# Patient Record
Sex: Female | Born: 1985 | Race: White | Hispanic: No | Marital: Single | Smoking: Current every day smoker
Health system: Southern US, Community
[De-identification: ages and names within clinical notes are randomized; demographics above are authoritative.]

## PROBLEM LIST (undated history)

## (undated) DIAGNOSIS — N809 Endometriosis, unspecified: Secondary | ICD-10-CM

---
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
## 2007-04-02 ENCOUNTER — Emergency Department: Payer: Self-pay | Admitting: Internal Medicine

## 2008-04-02 ENCOUNTER — Emergency Department: Payer: Self-pay | Admitting: Emergency Medicine

## 2008-04-09 ENCOUNTER — Inpatient Hospital Stay: Payer: Self-pay | Admitting: Surgery

## 2008-12-27 ENCOUNTER — Observation Stay: Payer: Self-pay

## 2013-10-29 ENCOUNTER — Emergency Department: Payer: Self-pay | Admitting: Emergency Medicine

## 2013-10-29 LAB — URINALYSIS, COMPLETE
BACTERIA: NONE SEEN
BILIRUBIN, UR: NEGATIVE
GLUCOSE, UR: NEGATIVE mg/dL (ref 0–75)
Ketone: NEGATIVE
Leukocyte Esterase: NEGATIVE
NITRITE: NEGATIVE
PH: 5 (ref 4.5–8.0)
Protein: NEGATIVE
Specific Gravity: 1.011 (ref 1.003–1.030)

## 2013-10-29 LAB — WET PREP, GENITAL

## 2013-10-29 LAB — GC/CHLAMYDIA PROBE AMP

## 2015-05-11 ENCOUNTER — Encounter: Payer: Self-pay | Admitting: Emergency Medicine

## 2015-05-11 ENCOUNTER — Emergency Department
Admission: EM | Admit: 2015-05-11 | Discharge: 2015-05-11 | Disposition: A | Discharge status: Home / Self Care | Payer: Self-pay | Attending: Emergency Medicine | Admitting: Emergency Medicine

## 2015-05-11 DIAGNOSIS — Y9289 Other specified places as the place of occurrence of the external cause: Secondary | ICD-10-CM | POA: Insufficient documentation

## 2015-05-11 DIAGNOSIS — W228XXA Striking against or struck by other objects, initial encounter: Secondary | ICD-10-CM | POA: Insufficient documentation

## 2015-05-11 DIAGNOSIS — Z880 Allergy status to penicillin: Secondary | ICD-10-CM | POA: Insufficient documentation

## 2015-05-11 DIAGNOSIS — Y9389 Activity, other specified: Secondary | ICD-10-CM | POA: Insufficient documentation

## 2015-05-11 DIAGNOSIS — Y998 Other external cause status: Secondary | ICD-10-CM | POA: Insufficient documentation

## 2015-05-11 DIAGNOSIS — S0502XA Injury of conjunctiva and corneal abrasion without foreign body, left eye, initial encounter: Secondary | ICD-10-CM | POA: Insufficient documentation

## 2015-05-11 MED ORDER — HYDROXYZINE HCL 50 MG PO TABS: 50.0000 mg | ORAL_TABLET | Freq: Three times a day (TID) | ORAL | Status: DC | PRN

## 2015-05-11 MED ORDER — OXYCODONE-ACETAMINOPHEN 5-325 MG PO TABS
2.0000 | ORAL_TABLET | Freq: Once | ORAL | Status: AC
  Administered 2015-05-11: 2 via ORAL
  Filled 2015-05-11: qty 2

## 2015-05-11 MED ORDER — FLUORESCEIN SODIUM 1 MG OP STRP
ORAL_STRIP | OPHTHALMIC | Status: AC
  Filled 2015-05-11: qty 1

## 2015-05-11 MED ORDER — GENTAMICIN SULFATE 0.3 % OP SOLN: 1.0000 [drp] | Freq: Four times a day (QID) | OPHTHALMIC | Status: DC

## 2015-05-11 MED ORDER — HYDROXYZINE HCL 50 MG PO TABS
50.0000 mg | ORAL_TABLET | Freq: Once | ORAL | Status: AC
  Administered 2015-05-11: 50 mg via ORAL
  Filled 2015-05-11: qty 1

## 2015-05-11 MED ORDER — TETRACAINE HCL 0.5 % OP SOLN
OPHTHALMIC | Status: AC
  Filled 2015-05-11: qty 2

## 2015-05-11 MED ORDER — TRAMADOL HCL 50 MG PO TABS: 50.0000 mg | ORAL_TABLET | Freq: Four times a day (QID) | ORAL | Status: DC | PRN

## 2015-05-11 MED ORDER — EYE WASH OPHTH SOLN
OPHTHALMIC | Status: AC
  Filled 2015-05-11: qty 118

## 2015-05-11 NOTE — ED Notes (Addendum)
Pt reports that she "stabbed" herself in the eye with mascara wand yesterday morning. Pt is having difficulty opening her eye and it is very sensitive to light. Eye is red, swollen, and draining. Pt alert & oriented.

## 2015-05-11 NOTE — ED Notes (Signed)
Pt discharged home after verbalizing understanding of discharge instructions; nad noted. 

## 2015-05-11 NOTE — ED Provider Notes (Signed)
[facility] Emergency Department Provider Note  ____________________________________________  Time seen: Approximately 10:13 AM  I have reviewed the triage vital signs and the nursing notes.   HISTORY  Chief Complaint Eye Drainage    HPI Shari Thornton is a 29 y.o. female left eye painsecondary to stabbing himself with an eye mascara applicator yesterday. Patient had pain and difficulty opening her eye. Patient states she is very sensitive to light. Patient has watery discharge mild edema. No palliative measures taken for this complaint. Patient is rating her pain as a 10 over 10. Patient describes the pain as sharp.  History reviewed. No pertinent past medical history.  There are no active problems to display for this patient.   History reviewed. No pertinent past surgical history.  Current Outpatient Rx  Name  Route  Sig  Dispense  Refill  . gentamicin (GARAMYCIN) 0.3 % ophthalmic solution   Left Eye   Place 1 drop into the left eye 4 (four) times daily.   5 mL   0   . traMADol (ULTRAM) 50 MG tablet   Oral   Take 1 tablet (50 mg total) by mouth every 6 (six) hours as needed for moderate pain.   12 tablet   0     Allergies Amoxil and Penicillins  No family history on file.  Social History Social History  Substance Use Topics  . Smoking status: Never Smoker   . Smokeless tobacco: None  . Alcohol Use: No    Review of Systems Constitutional: No fever/chills Eyes: No visual changes. ENT: No sore throat. Cardiovascular: Denies chest pain. Respiratory: Denies shortness of breath. Gastrointestinal: No abdominal pain.  No nausea, no vomiting.  No diarrhea.  No constipation. Genitourinary: Negative for dysuria. Musculoskeletal: Negative for back pain. Skin: Negative for rash. Neurological: Negative for headaches, focal weakness or numbness. Hematological/Lymphatic: Allergic/Immunilogical: Amoxicillin  10-point ROS otherwise  negative.  ____________________________________________   PHYSICAL EXAM:  VITAL SIGNS: ED Triage Vitals  Enc Vitals Group     BP --      Pulse --      Resp --      Temp --      Temp src --      SpO2 --      Weight --      Height --      Head Cir --      Peak Flow --      Pain Score 05/11/15 0906 7     Pain Loc --      Pain Edu? --      Excl. in GC? --     Constitutional: Alert and oriented. Moderate distress Eyes: Conjunctivae are normal. PERRL. EOMI. unable to give vision acuity affected eye secondary to complain of pain. Fluoresceins stain revealed no 0.5 cm corneal abrasion. Head: Atraumatic. Nose: No congestion/rhinnorhea. Mouth/Throat: Mucous membranes are moist.  Oropharynx non-erythematous. Neck: No stridor. No cervical spine tenderness to palpation. Hematological/Lymphatic/Immunilogical: No cervical lymphadenopathy. Cardiovascular: Normal rate, regular rhythm. Grossly normal heart sounds.  Good peripheral circulation. Respiratory: Normal respiratory effort.  No retractions. Lungs CTAB. Gastrointestinal: Soft and nontender. No distention. No abdominal bruits. No CVA tenderness. Musculoskeletal: No lower extremity tenderness nor edema.  No joint effusions. Neurologic:  Normal speech and language. No gross focal neurologic deficits are appreciated. No gait instability. Skin:  Skin is warm, dry and intact. No rash noted. Psychiatric: Mood and affect are normal. Speech and behavior are normal.  ____________________________________________   LABS (all  labs ordered are listed, but only abnormal results are displayed)  Labs Reviewed - No data to display ____________________________________________  EKG   ____________________________________________  RADIOLOGY   ____________________________________________   PROCEDURES  Procedure(s) performed: None  Critical Care performed: No  ____________________________________________   INITIAL IMPRESSION /  ASSESSMENT AND PLAN / ED COURSE  Pertinent labs & imaging results that were available during my care of the patient were reviewed by me and considered in my medical decision making (see chart for details).  Left corneal abrasion. Patient given discharge home care instructions. Patient given a prescription for Garamycin ophthalmic drops, Atarax and tramadol. Patient advised to follow-up with the [facility] if no improvement or worsening her complaint in 2 days. ____________________________________________   FINAL CLINICAL IMPRESSION(S) / ED DIAGNOSES  Final diagnoses:  Left corneal abrasion, initial encounter      Joni ReiningRonald K Chrstopher Malenfant, PA-C 05/11/15 1037  Joni Reiningonald K Kamrin Spath, PA-C 05/11/15 1039  Emily FilbertJonathan E Williams, MD 05/11/15 1350

## 2015-05-11 NOTE — Discharge Instructions (Signed)
Corneal Abrasion °The cornea is the clear covering at the front and center of the eye. When you look at the colored portion of the eye, you are looking through the cornea. It is a thin tissue made up of layers. The top layer is the most sensitive layer. A corneal abrasion happens if this layer is scratched or an injury causes it to come off.  °HOME CARE °· You may be given drops or a medicated cream. Use the medicine as told by your doctor. °· A pressure patch may be put over the eye. If this is done, follow your doctor's instructions for when to remove the patch. Do not drive or use machines while the eye patch is on. Judging distances is hard to do with a patch on. °· See your doctor for a follow-up exam if you are told to do so. It is very important that you keep this appointment. °GET HELP IF:  °· You have pain, are sensitive to light, and have a scratchy feeling in one eye or both eyes. °· Your pressure patch keeps getting loose. You can blink your eye under the patch. °· You have fluid coming from your eye or the lids stick together in the morning. °· You have the same symptoms in the morning that you did with the first abrasion. This could be days, weeks, or months after the first abrasion healed. °  °This information is not intended to replace advice given to you by your health care provider. Make sure you discuss any questions you have with your health care provider. °  °Document Released: 10/18/2007 Document Revised: 01/20/2015 Document Reviewed: 01/06/2013 °Elsevier Interactive Patient Education ©2016 Elsevier [employer]. ° °

## 2015-05-11 NOTE — ED Notes (Signed)
States she stuck a mascara brush in left eye  Eye red and irritated

## 2015-08-05 ENCOUNTER — Emergency Department
Admission: EM | Admit: 2015-08-05 | Discharge: 2015-08-05 | Disposition: A | Discharge status: Home / Self Care | Payer: Self-pay | Attending: Emergency Medicine | Admitting: Emergency Medicine

## 2015-08-05 ENCOUNTER — Emergency Department: Payer: Self-pay

## 2015-08-05 ENCOUNTER — Encounter: Payer: Self-pay | Admitting: Emergency Medicine

## 2015-08-05 DIAGNOSIS — Z792 Long term (current) use of antibiotics: Secondary | ICD-10-CM | POA: Insufficient documentation

## 2015-08-05 DIAGNOSIS — J069 Acute upper respiratory infection, unspecified: Secondary | ICD-10-CM

## 2015-08-05 DIAGNOSIS — R0789 Other chest pain: Secondary | ICD-10-CM | POA: Insufficient documentation

## 2015-08-05 DIAGNOSIS — Z880 Allergy status to penicillin: Secondary | ICD-10-CM | POA: Insufficient documentation

## 2015-08-05 DIAGNOSIS — J101 Influenza due to other identified influenza virus with other respiratory manifestations: Secondary | ICD-10-CM | POA: Insufficient documentation

## 2015-08-05 LAB — RAPID INFLUENZA A&B ANTIGENS
Influenza A (ARMC): POSITIVE — AB
Influenza B (ARMC): NEGATIVE

## 2015-08-05 MED ORDER — IBUPROFEN 800 MG PO TABS: 800.0000 mg | ORAL_TABLET | Freq: Three times a day (TID) | ORAL | Status: DC | PRN

## 2015-08-05 MED ORDER — GUAIFENESIN-CODEINE 100-10 MG/5ML PO SOLN: 10.0000 mL | ORAL | Status: DC | PRN

## 2015-08-05 MED ORDER — OSELTAMIVIR PHOSPHATE 75 MG PO CAPS: 75.0000 mg | ORAL_CAPSULE | Freq: Two times a day (BID) | ORAL | Status: DC

## 2015-08-05 MED ORDER — AZITHROMYCIN 250 MG PO TABS: ORAL_TABLET | ORAL | Status: DC

## 2015-08-05 MED ORDER — CHLORPHENIRAMINE MALEATE 4 MG PO TABS: 4.0000 mg | ORAL_TABLET | Freq: Two times a day (BID) | ORAL | Status: DC | PRN

## 2015-08-05 MED ORDER — PSEUDOEPHEDRINE HCL 60 MG PO TABS: 60.0000 mg | ORAL_TABLET | ORAL | Status: DC | PRN

## 2015-08-05 NOTE — ED Notes (Signed)
See triage note   States she developed fever,body aches and cough 2 days ago.. Having some discomfort in chest with cough

## 2015-08-05 NOTE — ED Provider Notes (Signed)
[facility] Emergency Department Provider Note  ____________________________________________  Time seen: Approximately 10:02 AM  I have reviewed the triage vital signs and the nursing notes.   HISTORY  Chief Complaint Generalized Body Aches; Fever; and Cough    HPI Shari Thornton is a 30 y.o. female presents for evaluation of fever chills body aches and cough for 2 days. Patient states that she has some discomfort in her chest associated with a cough. Patient reports taking medicines over-the-counter but has not had any relief.   History reviewed. No pertinent past medical history.  There are no active problems to display for this patient.   History reviewed. No pertinent past surgical history.  Current Outpatient Rx  Name  Route  Sig  Dispense  Refill  . azithromycin (ZITHROMAX Z-PAK) 250 MG tablet      Take 2 tablets (500 mg) on  Day 1,  followed by 1 tablet (250 mg) once daily on Days 2 through 5.   6 each   0   . chlorpheniramine (CHLOR-TRIMETON) 4 MG tablet   Oral   Take 1 tablet (4 mg total) by mouth 2 (two) times daily as needed for allergies or rhinitis.   30 tablet   0   . gentamicin (GARAMYCIN) 0.3 % ophthalmic solution   Left Eye   Place 1 drop into the left eye 4 (four) times daily.   5 mL   0   . guaiFENesin-codeine 100-10 MG/5ML syrup   Oral   Take 10 mLs by mouth every 4 (four) hours as needed for cough.   180 mL   0   . hydrOXYzine (ATARAX/VISTARIL) 50 MG tablet   Oral   Take 1 tablet (50 mg total) by mouth 3 (three) times daily as needed.   30 tablet   0   . ibuprofen (ADVIL,MOTRIN) 800 MG tablet   Oral   Take 1 tablet (800 mg total) by mouth every 8 (eight) hours as needed.   30 tablet   0   . oseltamivir (TAMIFLU) 75 MG capsule   Oral   Take 1 capsule (75 mg total) by mouth 2 (two) times daily.   10 capsule   0   . pseudoephedrine (SUDAFED) 60 MG tablet   Oral   Take 1 tablet (60 mg total) by mouth  every 4 (four) hours as needed for congestion.   24 tablet   0   . traMADol (ULTRAM) 50 MG tablet   Oral   Take 1 tablet (50 mg total) by mouth every 6 (six) hours as needed for moderate pain.   12 tablet   0     Allergies Amoxil; Eggs or egg-derived products; and Penicillins  History reviewed. No pertinent family history.  Social History Social History  Substance Use Topics  . Smoking status: Never Smoker   . Smokeless tobacco: None  . Alcohol Use: No    Review of Systems Constitutional: Positive fever/chills Eyes: No visual changes. ENT: Positive sore throat. Cardiovascular: Denies chest pain. Respiratory: Denies shortness of breath. Positive for productive cough Genitourinary: Negative for dysuria. Musculoskeletal: Positive for generalized muscle aches and chest wall pain. Neurological: Negative for headaches, focal weakness or numbness.  10-point ROS otherwise negative.  ____________________________________________   PHYSICAL EXAM:  VITAL SIGNS: ED Triage Vitals  Enc Vitals Group     BP 08/05/15 0858 124/74 mmHg     Pulse Rate 08/05/15 0858 87     Resp 08/05/15 0858 18  Temp 08/05/15 0858 98.3 F (36.8 C)     Temp Source 08/05/15 0858 Oral     SpO2 08/05/15 0858 97 %     Weight 08/05/15 0858 170 lb (77.111 kg)     Height 08/05/15 0858 5' (1.524 m)     Head Cir --      Peak Flow --      Pain Score 08/05/15 0858 9     Pain Loc --      Pain Edu? --      Excl. in GC? --     Constitutional: Alert and oriented. Well appearing and in no acute distress. Nose: Positive congestion/rhinnorhea. Bilateral anterior turbinate edema noted. Mouth/Throat: Mucous membranes are moist.  Oropharynx non-erythematous. Neck: No stridor.  Full range of motion nontender. Cardiovascular: Normal rate, regular rhythm. Grossly normal heart sounds.  Good peripheral circulation. Respiratory: Normal respiratory effort.  No retractions. Lungs CTAB. Musculoskeletal: No lower  extremity tenderness nor edema.  No joint effusions. Neurologic:  Normal speech and language. No gross focal neurologic deficits are appreciated. No gait instability. Skin:  Skin is warm, dry and intact. No rash noted. Psychiatric: Mood and affect are normal. Speech and behavior are normal.  ____________________________________________   LABS (all labs ordered are listed, but only abnormal results are displayed)  Labs Reviewed  RAPID INFLUENZA A&B ANTIGENS ([facility] ONLY) - Abnormal; Notable for the following:    Influenza A ([facility]) POSITIVE (*)    All other components within normal limits   ____________________________________________   RADIOLOGY  No edema or consolidation. No evidence of pneumonia. ____________________________________________   PROCEDURES  Procedure(s) performed: None  Critical Care performed: No  ____________________________________________   INITIAL IMPRESSION / ASSESSMENT AND PLAN / ED COURSE  Pertinent labs & imaging results that were available during my care of the patient were reviewed by me and considered in my medical decision making (see chart for details).  Acute influenza A. Rx given for Tamiflu, Z-Pak, Robitussin-AC, chlorpheniramine. Patient follow-up with her PCP or return to ER with any worsening symptomology. ____________________________________________   FINAL CLINICAL IMPRESSION(S) / ED DIAGNOSES  Final diagnoses:  Influenza A  URI, acute     This chart was dictated using voice recognition software/Dragon. Despite best efforts to proofread, errors can occur which can change the meaning. Any change was purely unintentional.   Evangeline Dakinharles M Alnita Aybar, PA-C 08/05/15 1620  Emily FilbertJonathan E Williams, MD 08/06/15 1101

## 2015-08-05 NOTE — ED Notes (Signed)
Pt to ed with c/o body aches, fever and cough since Monday.  Pt states daughter and husband both diagnosed with the flu recently.

## 2015-08-05 NOTE — Discharge Instructions (Signed)
Influenza, Adult °Influenza ("the flu") is a viral infection of the respiratory tract. It occurs more often in winter months because people spend more time in close contact with one another. Influenza can make you feel very sick. Influenza easily spreads from person to person (contagious). °CAUSES  °Influenza is caused by a virus that infects the respiratory tract. You can catch the virus by breathing in droplets from an infected person's cough or sneeze. You can also catch the virus by touching something that was recently contaminated with the virus and then touching your mouth, nose, or eyes. °RISKS AND COMPLICATIONS °You may be at risk for a more severe case of influenza if you smoke cigarettes, have diabetes, have chronic heart disease (such as heart failure) or lung disease (such as asthma), or if you have a weakened immune system. Elderly people and pregnant women are also at risk for more serious infections. The most common problem of influenza is a lung infection (pneumonia). Sometimes, this problem can require emergency medical care and may be life threatening. °SIGNS AND SYMPTOMS  °Symptoms typically last 4 to 10 days and may include: °· Fever. °· Chills. °· Headache, body aches, and muscle aches. °· Sore throat. °· Chest discomfort and cough. °· Poor appetite. °· Weakness or feeling tired. °· Dizziness. °· Nausea or vomiting. °DIAGNOSIS  °Diagnosis of influenza is often made based on your history and a physical exam. A nose or throat swab test can be done to confirm the diagnosis. °TREATMENT  °In mild cases, influenza goes away on its own. Treatment is directed at relieving symptoms. For more severe cases, your health care provider may prescribe antiviral medicines to shorten the sickness. Antibiotic medicines are not effective because the infection is caused by a virus, not by bacteria. °HOME CARE INSTRUCTIONS °· Take medicines only as directed by your health care provider. °· Use a cool mist humidifier  to make breathing easier. °· Get plenty of rest until your temperature returns to normal. This usually takes 3 to 4 days. °· Drink enough fluid to keep your urine clear or pale yellow. °· Cover your mouth and nose when coughing or sneezing, and wash your hands well to prevent the virus from spreading. °· Stay home from work or school until the fever is gone for at least 1 full day. °PREVENTION  °An annual influenza vaccination (flu shot) is the best way to avoid getting influenza. An annual flu shot is now routinely recommended for all adults in the [place] °SEEK MEDICAL CARE IF: °· You experience chest pain, your cough worsens, or you produce more mucus. °· You have nausea, vomiting, or diarrhea. °· Your fever returns or gets worse. °SEEK IMMEDIATE MEDICAL CARE IF: °· You have trouble breathing, you become short of breath, or your skin or nails become bluish. °· You have severe pain or stiffness in the neck. °· You develop a sudden headache, or pain in the face or ear. °· You have nausea or vomiting that you cannot control. °MAKE SURE YOU:  °· Understand these instructions. °· Will watch your condition. °· Will get help right away if you are not doing well or get worse. °  °This information is not intended to replace advice given to you by your health care provider. Make sure you discuss any questions you have with your health care provider. °  °Document Released: 04/28/2000 Document Revised: 05/22/2014 Document Reviewed: 07/31/2011 °Elsevier Interactive Patient Education ©2016 Elsevier Inc. ° °Upper Respiratory Infection, Adult °Most upper respiratory infections (URIs) are a viral infection of the air passages leading to the lungs. A URI affects the nose, throat, and upper air passages. The most common   type of URI is nasopharyngitis and is typically referred to as "the common cold." °URIs run their course and usually go away on their own. Most of the time, a URI does not require medical attention, but sometimes a  bacterial infection in the upper airways can follow a viral infection. This is called a secondary infection. Sinus and middle ear infections are common types of secondary upper respiratory infections. °Bacterial pneumonia can also complicate a URI. A URI can worsen asthma and chronic obstructive pulmonary disease (COPD). Sometimes, these complications can require emergency medical care and may be life threatening.  °CAUSES °Almost all URIs are caused by viruses. A virus is a type of germ and can spread from one person to another.  °RISKS FACTORS °You may be at risk for a URI if:  °· You smoke.   °· You have chronic heart or lung disease. °· You have a weakened defense (immune) system.   °· You are very young or very old.   °· You have nasal allergies or asthma. °· You work in crowded or poorly ventilated areas. °· You work in health care facilities or schools. °SIGNS AND SYMPTOMS  °Symptoms typically develop 2-3 days after you come in contact with a cold virus. Most viral URIs last 7-10 days. However, viral URIs from the influenza virus (flu virus) can last 14-18 days and are typically more severe. Symptoms may include:  °· Runny or stuffy (congested) nose.   °· Sneezing.   °· Cough.   °· Sore throat.   °· Headache.   °· Fatigue.   °· Fever.   °· Loss of appetite.   °· Pain in your forehead, behind your eyes, and over your cheekbones (sinus pain). °· Muscle aches.   °DIAGNOSIS  °Your health care provider may diagnose a URI by: °· Physical exam. °· Tests to check that your symptoms are not due to another condition such as: °¨ Strep throat. °¨ Sinusitis. °¨ Pneumonia. °¨ Asthma. °TREATMENT  °A URI goes away on its own with time. It cannot be cured with medicines, but medicines may be prescribed or recommended to relieve symptoms. Medicines may help: °· Reduce your fever. °· Reduce your cough. °· Relieve nasal congestion. °HOME CARE INSTRUCTIONS  °· Take medicines only as directed by your health care provider.    °· Gargle warm saltwater or take cough drops to comfort your throat as directed by your health care provider. °· Use a warm mist humidifier or inhale steam from a shower to increase air moisture. This may make it easier to breathe. °· Drink enough fluid to keep your urine clear or pale yellow.   °· Eat soups and other clear broths and maintain good nutrition.   °· Rest as needed.   °· Return to work when your temperature has returned to normal or as your health care provider advises. You may need to stay home longer to avoid infecting others. You can also use a face mask and careful hand washing to prevent spread of the virus. °· Increase the usage of your inhaler if you have asthma.   °· Do not use any tobacco products, including cigarettes, chewing tobacco, or electronic cigarettes. If you need help quitting, ask your health care provider. °PREVENTION  °The best way to protect yourself from getting a cold is to practice good hygiene.  °· Avoid oral or hand contact with people with cold symptoms.   °· Wash your hands often if contact occurs.   °There is no clear evidence that vitamin C, vitamin E, echinacea, or exercise reduces the chance of developing a cold. However, it   is always recommended to get plenty of rest, exercise, and practice good nutrition.  °SEEK MEDICAL CARE IF:  °· You are getting worse rather than better.   °· Your symptoms are not controlled by medicine.   °· You have chills. °· You have worsening shortness of breath. °· You have brown or red mucus. °· You have yellow or brown nasal discharge. °· You have pain in your face, especially when you bend forward. °· You have a fever. °· You have swollen neck glands. °· You have pain while swallowing. °· You have white areas in the back of your throat. °SEEK IMMEDIATE MEDICAL CARE IF:  °· You have severe or persistent: °¨ Headache. °¨ Ear pain. °¨ Sinus pain. °¨ Chest pain. °· You have chronic lung disease and any of the  following: °¨ Wheezing. °¨ Prolonged cough. °¨ Coughing up blood. °¨ A change in your usual mucus. °· You have a stiff neck. °· You have changes in your: °¨ Vision. °¨ Hearing. °¨ Thinking. °¨ Mood. °MAKE SURE YOU:  °· Understand these instructions. °· Will watch your condition. °· Will get help right away if you are not doing well or get worse. °  °This information is not intended to replace advice given to you by your health care provider. Make sure you discuss any questions you have with your health care provider. °  °Document Released: 10/25/2000 Document Revised: 09/15/2014 Document Reviewed: 08/06/2013 °Elsevier Interactive Patient Education ©2016 [employer]. ° °

## 2016-05-01 ENCOUNTER — Emergency Department: Payer: Self-pay

## 2016-05-01 ENCOUNTER — Encounter: Payer: Self-pay | Admitting: Emergency Medicine

## 2016-05-01 ENCOUNTER — Emergency Department
Admission: EM | Admit: 2016-05-01 | Discharge: 2016-05-01 | Disposition: A | Discharge status: Home / Self Care | Payer: Self-pay | Attending: Emergency Medicine | Admitting: Emergency Medicine

## 2016-05-01 DIAGNOSIS — Y9389 Activity, other specified: Secondary | ICD-10-CM | POA: Insufficient documentation

## 2016-05-01 DIAGNOSIS — Y999 Unspecified external cause status: Secondary | ICD-10-CM | POA: Insufficient documentation

## 2016-05-01 DIAGNOSIS — Y92009 Unspecified place in unspecified non-institutional (private) residence as the place of occurrence of the external cause: Secondary | ICD-10-CM | POA: Insufficient documentation

## 2016-05-01 DIAGNOSIS — W208XXA Other cause of strike by thrown, projected or falling object, initial encounter: Secondary | ICD-10-CM | POA: Insufficient documentation

## 2016-05-01 DIAGNOSIS — S9031XA Contusion of right foot, initial encounter: Secondary | ICD-10-CM | POA: Insufficient documentation

## 2016-05-01 MED ORDER — HYDROCODONE-ACETAMINOPHEN 5-325 MG PO TABS: 1.0000 | ORAL_TABLET | Freq: Four times a day (QID) | ORAL | 0 refills | Status: DC | PRN

## 2016-05-01 MED ORDER — OXYCODONE-ACETAMINOPHEN 5-325 MG PO TABS
1.0000 | ORAL_TABLET | Freq: Once | ORAL | Status: AC
  Administered 2016-05-01: 1 via ORAL
  Filled 2016-05-01: qty 1

## 2016-05-01 NOTE — ED Provider Notes (Signed)
[facility] Emergency Department Provider Note  ____________________________________________  Time seen: Approximately 1:23 PM  I have reviewed the triage vital signs and the nursing notes.   HISTORY  Chief Complaint Foot Pain    HPI Shari Thornton is a 30 y.o. female that presents to the emergency department with right foot pain for 1 day. Patient was cleaning house yesterday and a very heavy wooden picture frame fell on right foot. Patient states that the swelling has improved since yesterday. Patient has continued to walk on foot. Patient has crutches at home that she has been trying to use. Patient has not taken anything today for pain.   History reviewed. No pertinent past medical history.  There are no active problems to display for this patient.   History reviewed. No pertinent surgical history.  Prior to Admission medications   Medication Sig Start Date End Date Taking? Authorizing Provider  HYDROcodone-acetaminophen (NORCO/VICODIN) 5-325 MG tablet Take 1 tablet by mouth every 6 (six) hours as needed for moderate pain. 05/01/16   Enid DerryAshley Aaisha Sliter, PA-C    Allergies Amoxil [amoxicillin]; Eggs or egg-derived products; and Penicillins  No family history on file.  Social History Social History  Substance Use Topics  . Smoking status: Never Smoker  . Smokeless tobacco: Never Used  . Alcohol use No     Review of Systems  Constitutional: No fever/chills ENT: No upper respiratory complaints. Cardiovascular: No chest pain. Respiratory: No cough. No SOB. Gastrointestinal: No abdominal pain.  No nausea, no vomiting.  Skin: Negative for lacerations   ____________________________________________   PHYSICAL EXAM:  VITAL SIGNS: ED Triage Vitals  Enc Vitals Group     BP 05/01/16 1206 (!) 145/87     Pulse Rate 05/01/16 1206 88     Resp 05/01/16 1206 18     Temp 05/01/16 1206 97.9 F (36.6 C)     Temp src --      SpO2 05/01/16 1206 100 %      Weight 05/01/16 1207 180 lb (81.6 kg)     Height 05/01/16 1207 5' (1.524 m)     Head Circumference --      Peak Flow --      Pain Score 05/01/16 1207 8     Pain Loc --      Pain Edu? --      Excl. in GC? --      Constitutional: Alert and oriented. Well appearing and in no acute distress. Eyes: Conjunctivae are normal. PERRL. EOMI. Head: Atraumatic. ENT:      Ears:      Nose: No congestion/rhinnorhea.      Mouth/Throat: Mucous membranes are moist.  Neck: No stridor.  Cardiovascular: Normal rate, regular rhythm. Normal S1 and S2.  Good peripheral circulation. 2+ dorsalis pedis and posterior tibialis pulses. Respiratory: Normal respiratory effort without tachypnea or retractions. Lungs CTAB. Good air entry to the bases with no decreased or absent breath sounds. Musculoskeletal: Full range of motion to all extremities. Swelling and bruising over the dorsal aspect of right foot. Patient is able to move toes. Neurologic:  Normal speech and language. No gross focal neurologic deficits are appreciated. Sensation of toes intact. Skin:  Skin is warm, dry and intact.  Psychiatric: Mood and affect are normal. Speech and behavior are normal. Patient exhibits appropriate insight and judgement.   ____________________________________________   LABS (all labs ordered are listed, but only abnormal results are displayed)  Labs Reviewed - No data to display ____________________________________________  EKG  ____________________________________________  RADIOLOGY Lexine BatonI, Jakiah Goree, personally viewed and evaluated these images (plain radiographs) as part of my medical decision making, as well as reviewing the written report by the radiologist.  Dg Foot Complete Right  Result Date: 05/01/2016 CLINICAL DATA:  Picture frame fell on foot yesterday with pain, initial encounter EXAM: RIGHT FOOT COMPLETE - 3+ VIEW COMPARISON:  None. FINDINGS: Considerable soft tissue swelling is noted of the  dorsum of the foot. No acute fracture or dislocation is noted. IMPRESSION: Soft tissue swelling without acute bony abnormality. Electronically Signed   By: Alcide CleverMark  Lukens M.D.   On: 05/01/2016 12:24    ____________________________________________    PROCEDURES  Procedure(s) performed:    Procedures    Medications  oxyCODONE-acetaminophen (PERCOCET/ROXICET) 5-325 MG per tablet 1 tablet (1 tablet Oral Given 05/01/16 1247)     ____________________________________________   INITIAL IMPRESSION / ASSESSMENT AND PLAN / ED COURSE  Pertinent labs & imaging results that were available during my care of the patient were reviewed by me and considered in my medical decision making (see chart for details).  Review of the [place] CSRS was performed in accordance of the [facility] prior to dispensing any controlled drugs.  Clinical Course     Patient's diagnosis is consistent with foot contusion. Patient states that swelling has gone down since yesterday. Patient has sensation in toes and good blood flow to toes. Patient states foot was wrapped and bruising was applied. Patient has crutches at home that she will use. Patient will be discharged home with prescriptions for Norco. Patient is to follow up with PCP as directed. Patient is given ED precautions to return to the ED for any worsening or new symptoms. All questions were answered.     ____________________________________________  FINAL CLINICAL IMPRESSION(S) / ED DIAGNOSES  Final diagnoses:  Contusion of right foot, initial encounter      NEW MEDICATIONS STARTED DURING THIS VISIT:  Discharge Medication List as of 05/01/2016 12:56 PM          This chart was dictated using voice recognition software/Dragon. Despite best efforts to proofread, errors can occur which can change the meaning. Any change was purely unintentional.    Enid DerryAshley Nahiem Dredge, PA-C 05/01/16 1327    Minna AntisKevin Paduchowski, MD 05/01/16 [id number]

## 2016-05-01 NOTE — ED Notes (Signed)
Ace wrap and post op boot done

## 2016-05-01 NOTE — ED Triage Notes (Signed)
Reports a picture frame falling off a wall onto foot last pm.  Swelling noted.  +PMS

## 2019-04-07 ENCOUNTER — Other Ambulatory Visit: Payer: Self-pay | Admitting: *Deleted

## 2019-04-07 DIAGNOSIS — Z20822 Contact with and (suspected) exposure to covid-19: Secondary | ICD-10-CM

## 2019-04-08 LAB — NOVEL CORONAVIRUS, NAA: SARS-CoV-2, NAA: NOT DETECTED

## 2019-04-24 ENCOUNTER — Emergency Department: Payer: Self-pay

## 2019-04-24 ENCOUNTER — Emergency Department
Admission: EM | Admit: 2019-04-24 | Discharge: 2019-04-24 | Disposition: A | Discharge status: Home / Self Care | Payer: Self-pay | Attending: Emergency Medicine | Admitting: Emergency Medicine

## 2019-04-24 ENCOUNTER — Encounter: Payer: Self-pay | Admitting: Emergency Medicine

## 2019-04-24 ENCOUNTER — Other Ambulatory Visit: Payer: Self-pay

## 2019-04-24 DIAGNOSIS — R1012 Left upper quadrant pain: Secondary | ICD-10-CM | POA: Insufficient documentation

## 2019-04-24 DIAGNOSIS — R9389 Abnormal findings on diagnostic imaging of other specified body structures: Secondary | ICD-10-CM | POA: Insufficient documentation

## 2019-04-24 DIAGNOSIS — K529 Noninfective gastroenteritis and colitis, unspecified: Secondary | ICD-10-CM | POA: Insufficient documentation

## 2019-04-24 DIAGNOSIS — R110 Nausea: Secondary | ICD-10-CM | POA: Insufficient documentation

## 2019-04-24 DIAGNOSIS — R109 Unspecified abdominal pain: Secondary | ICD-10-CM

## 2019-04-24 LAB — URINALYSIS, COMPLETE (UACMP) WITH MICROSCOPIC
Bacteria, UA: NONE SEEN
Bilirubin Urine: NEGATIVE
Glucose, UA: NEGATIVE mg/dL
Ketones, ur: NEGATIVE mg/dL
Leukocytes,Ua: NEGATIVE
Nitrite: NEGATIVE
Protein, ur: NEGATIVE mg/dL
Specific Gravity, Urine: 1.021 (ref 1.005–1.030)
pH: 5 (ref 5.0–8.0)

## 2019-04-24 LAB — CBC WITH DIFFERENTIAL/PLATELET
Abs Immature Granulocytes: 0.06 10*3/uL (ref 0.00–0.07)
Basophils Absolute: 0.1 10*3/uL (ref 0.0–0.1)
Basophils Relative: 0 %
Eosinophils Absolute: 0.5 10*3/uL (ref 0.0–0.5)
Eosinophils Relative: 4 %
HCT: 45.6 % (ref 36.0–46.0)
Hemoglobin: 15 g/dL (ref 12.0–15.0)
Immature Granulocytes: 0 %
Lymphocytes Relative: 25 %
Lymphs Abs: 3.5 10*3/uL (ref 0.7–4.0)
MCH: 28.7 pg (ref 26.0–34.0)
MCHC: 32.9 g/dL (ref 30.0–36.0)
MCV: 87.2 fL (ref 80.0–100.0)
Monocytes Absolute: 0.9 10*3/uL (ref 0.1–1.0)
Monocytes Relative: 7 %
Neutro Abs: 8.9 10*3/uL — ABNORMAL HIGH (ref 1.7–7.7)
Neutrophils Relative %: 64 %
Platelets: 301 10*3/uL (ref 150–400)
RBC: 5.23 MIL/uL — ABNORMAL HIGH (ref 3.87–5.11)
RDW: 12.2 % (ref 11.5–15.5)
WBC: 13.9 10*3/uL — ABNORMAL HIGH (ref 4.0–10.5)
nRBC: 0 % (ref 0.0–0.2)

## 2019-04-24 LAB — COMPREHENSIVE METABOLIC PANEL
ALT: 17 U/L (ref 0–44)
AST: 14 U/L — ABNORMAL LOW (ref 15–41)
Albumin: 4 g/dL (ref 3.5–5.0)
Alkaline Phosphatase: 83 U/L (ref 38–126)
Anion gap: 8 (ref 5–15)
BUN: 14 mg/dL (ref 6–20)
CO2: 24 mmol/L (ref 22–32)
Calcium: 8.8 mg/dL — ABNORMAL LOW (ref 8.9–10.3)
Chloride: 105 mmol/L (ref 98–111)
Creatinine, Ser: 0.69 mg/dL (ref 0.44–1.00)
GFR calc Af Amer: >60 mL/min (ref >60)
GFR calc non Af Amer: >60 mL/min (ref >60)
Glucose, Bld: 108 mg/dL — ABNORMAL HIGH (ref 70–99)
Potassium: 4.1 mmol/L (ref 3.5–5.1)
Sodium: 137 mmol/L (ref 135–145)
Total Bilirubin: 0.6 mg/dL (ref 0.3–1.2)
Total Protein: 7.5 g/dL (ref 6.5–8.1)

## 2019-04-24 LAB — POCT PREGNANCY, URINE: Preg Test, Ur: NEGATIVE

## 2019-04-24 MED ORDER — HYDROMORPHONE HCL 1 MG/ML IJ SOLN
1.0000 mg | Freq: Once | INTRAMUSCULAR | Status: AC
  Administered 2019-04-24: 1 mg via INTRAVENOUS
  Filled 2019-04-24: qty 1

## 2019-04-24 MED ORDER — ONDANSETRON 4 MG PO TBDP: 4.0000 mg | ORAL_TABLET | Freq: Three times a day (TID) | ORAL | 0 refills | Status: DC | PRN

## 2019-04-24 MED ORDER — HYDROCODONE-ACETAMINOPHEN 5-325 MG PO TABS: 1.0000 | ORAL_TABLET | Freq: Four times a day (QID) | ORAL | 0 refills | Status: AC | PRN

## 2019-04-24 MED ORDER — KETOROLAC TROMETHAMINE 30 MG/ML IJ SOLN
30.0000 mg | Freq: Once | INTRAMUSCULAR | Status: AC
  Administered 2019-04-24: 30 mg via INTRAVENOUS
  Filled 2019-04-24: qty 1

## 2019-04-24 MED ORDER — ONDANSETRON HCL 4 MG/2ML IJ SOLN
4.0000 mg | Freq: Once | INTRAMUSCULAR | Status: AC
  Administered 2019-04-24: 4 mg via INTRAVENOUS
  Filled 2019-04-24: qty 2

## 2019-04-24 MED ORDER — METRONIDAZOLE 500 MG PO TABS: 500.0000 mg | ORAL_TABLET | Freq: Three times a day (TID) | ORAL | 0 refills | Status: AC

## 2019-04-24 MED ORDER — CIPROFLOXACIN HCL 500 MG PO TABS: 500.0000 mg | ORAL_TABLET | Freq: Two times a day (BID) | ORAL | 0 refills | Status: AC

## 2019-04-24 NOTE — ED Notes (Signed)
Pt verbalized understanding of discharge instructions. NAD at this time. 

## 2019-04-24 NOTE — ED Triage Notes (Signed)
Patient ambulatory to triage with steady gait, without difficulty or distress noted, mask in place; pt reports since yesterday having left flank pain, nonradiating with no accomp symptoms

## 2019-04-24 NOTE — Discharge Instructions (Addendum)
Call Dr. Glenford Peers office to set up an appointment  I've called in pain medications and antibiotics  Stick to bland, non spicy foods for the next several days

## 2019-04-24 NOTE — ED Notes (Signed)
Patient transported to X-ray 

## 2019-04-24 NOTE — ED Provider Notes (Signed)
[facility] Emergency Department Provider Note  ____________________________________________   First MD Initiated Contact with Patient 04/24/19 [phone]     (approximate)  I have reviewed the triage vital signs and the nursing notes.   HISTORY  Chief Complaint Flank Pain    HPI Shari Thornton is a 33 y.o. female  Here with L flank pain. Pt reports that her sx started fairly acutely yesterday. She states she was just resting when she began to notice a sharp, aching, gnawing LUQ and L flank pain with some mild nausea. Pain has since been constant and has remained severe. It seems worse with movement, palpation. No alleviating factors. No cough or SOB, no hemoptysis. No urinary sx. No radiation of pain. She denies fever, chills. Does not recall any recent trauma. No other complaints. NO h/o same.        History reviewed. No pertinent past medical history.  There are no problems to display for this patient.   History reviewed. No pertinent surgical history.  Prior to Admission medications   Medication Sig Start Date End Date Taking? Authorizing Provider  ciprofloxacin (CIPRO) 500 MG tablet Take 1 tablet (500 mg total) by mouth 2 (two) times daily for 7 days. 04/24/19 05/01/19  Shaune Pollack, MD  HYDROcodone-acetaminophen (NORCO/VICODIN) 5-325 MG tablet Take 1 tablet by mouth every 6 (six) hours as needed for moderate pain. 05/01/16   Enid Derry, PA-C  HYDROcodone-acetaminophen (NORCO/VICODIN) 5-325 MG tablet Take 1-2 tablets by mouth every 6 (six) hours as needed for moderate pain or severe pain. 04/24/19 04/23/20  Shaune Pollack, MD  metroNIDAZOLE (FLAGYL) 500 MG tablet Take 1 tablet (500 mg total) by mouth 3 (three) times daily for 7 days. 04/24/19 05/01/19  Shaune Pollack, MD  ondansetron (ZOFRAN ODT) 4 MG disintegrating tablet Take 1 tablet (4 mg total) by mouth every 8 (eight) hours as needed for nausea or vomiting. 04/24/19   Shaune Pollack, MD     Allergies Amoxil [amoxicillin], Eggs or egg-derived products, and Penicillins  No family history on file.  Social History Social History   Tobacco Use   Smoking status: Never Smoker   Smokeless tobacco: Never Used  Substance Use Topics   Alcohol use: No   Drug use: Not on file    Review of Systems  Review of Systems  Constitutional: Negative for fatigue and fever.  HENT: Negative for congestion and sore throat.   Eyes: Negative for visual disturbance.  Respiratory: Negative for cough and shortness of breath.   Cardiovascular: Negative for chest pain.  Gastrointestinal: Positive for abdominal pain. Negative for diarrhea, nausea and vomiting.  Genitourinary: Positive for flank pain.  Musculoskeletal: Negative for back pain and neck pain.  Skin: Negative for rash and wound.  Neurological: Negative for weakness.     ____________________________________________  PHYSICAL EXAM:      VITAL SIGNS: ED Triage Vitals  Enc Vitals Group     BP 04/24/19 0427 127/90     Pulse Rate 04/24/19 0427 100     Resp 04/24/19 0427 16     Temp 04/24/19 0427 99 F (37.2 C)     Temp Source 04/24/19 0427 Oral     SpO2 04/24/19 0427 98 %     Weight 04/24/19 0425 200 lb (90.7 kg)     Height 04/24/19 0425 5' (1.524 m)     Head Circumference --      Peak Flow --      Pain Score 04/24/19 0424 10  Pain Loc --      Pain Edu? --      Excl. in GC? --      Physical Exam Vitals and nursing note reviewed.  Constitutional:      General: She is not in acute distress.    Appearance: She is well-developed.  HENT:     Head: Normocephalic and atraumatic.  Eyes:     Conjunctiva/sclera: Conjunctivae normal.  Cardiovascular:     Rate and Rhythm: Normal rate and regular rhythm.     Heart sounds: Normal heart sounds. No murmur. No friction rub.  Pulmonary:     Effort: Pulmonary effort is normal. No respiratory distress.     Breath sounds: Normal breath sounds. No wheezing or rales.   Abdominal:     General: There is no distension.     Palpations: Abdomen is soft.     Tenderness: There is abdominal tenderness (ttp in LUQ with increased BS; no RLQ TTP, no TTP at [facility]).  Musculoskeletal:     Cervical back: Neck supple.     Comments: Mild TTP over left upper flank, no rash or lesions, no bruising or deformity  Skin:    General: Skin is warm.     Capillary Refill: Capillary refill takes less than 2 seconds.  Neurological:     Mental Status: She is alert and oriented to person, place, and time.     Motor: No abnormal muscle tone.       ____________________________________________   LABS (all labs ordered are listed, but only abnormal results are displayed)  Labs Reviewed  CBC WITH DIFFERENTIAL/PLATELET - Abnormal; Notable for the following components:      Result Value   WBC 13.9 (*)    RBC 5.23 (*)    Neutro Abs 8.9 (*)    All other components within normal limits  COMPREHENSIVE METABOLIC PANEL - Abnormal; Notable for the following components:   Glucose, Bld 108 (*)    Calcium 8.8 (*)    AST 14 (*)    All other components within normal limits  URINALYSIS, COMPLETE (UACMP) WITH MICROSCOPIC - Abnormal; Notable for the following components:   Color, Urine YELLOW (*)    APPearance HAZY (*)    Hgb urine dipstick MODERATE (*)    All other components within normal limits  POCT PREGNANCY, URINE    ____________________________________________  EKG: None ________________________________________  RADIOLOGY All imaging, including plain films, CT scans, and ultrasounds, independently reviewed by me, and interpretations confirmed via formal radiology reads.  ED MD interpretation:   CT Stone: Focal enteritis LUQ, dilated appendix w/o inflammatory changes, ovarian cysts  Official radiology report(s): DG Chest 2 View  Result Date: 04/24/2019 CLINICAL DATA:  Left chest pain. EXAM: CHEST - 2 VIEW COMPARISON:  08/05/2015. FINDINGS: Mediastinum and hilar  structures normal. Low lung volumes mild bibasilar subsegmental atelectasis. No pleural effusion or pneumothorax. No acute bony abnormality identified. IMPRESSION: Low lung volumes mild bibasilar subsegmental atelectasis. Exam otherwise unremarkable. Electronically Signed   By: Maisie Fus  Register   On: 04/24/2019 08:44   CT Renal Stone Study  Result Date: 04/24/2019 CLINICAL DATA:  Left flank pain. EXAM: CT ABDOMEN AND PELVIS WITHOUT CONTRAST TECHNIQUE: Multidetector CT imaging of the abdomen and pelvis was performed following the standard protocol without IV contrast. COMPARISON:  None. FINDINGS: Lower chest: Lung bases are clear. Hepatobiliary: No focal liver abnormality is seen. Elongated left lobe of the liver wrapping into the left upper quadrant. No gallstones, gallbladder wall thickening, or biliary dilatation.  Pancreas: No ductal dilatation or inflammation. Spleen: Normal in size without focal abnormality. Adrenals/Urinary Tract: Normal adrenal glands. No hydronephrosis. No perinephric edema. No stones in the kidneys, course of the ureters or urinary bladder. Urinary bladder is minimally distended. No bladder wall thickening. Stomach/Bowel: Stomach is nondistended and not well assessed. Suspect straight segment of small bowel wall thickening in the left upper quadrant, series 2, image 36 mild adjacent mesenteric edema. No obstruction. There is fecalization of small bowel contents. The appendix is dilated measuring up to 11 mm. Appendiceal low-density may be submucosal fatty infiltration or intraluminal contents. No periappendiceal fat stranding. No periappendiceal collection. Moderate volume of stool throughout the entire colon. No colonic wall thickening or inflammation. Vascular/Lymphatic: Abdominal aorta is normal in caliber. Increased number of small retroperitoneal nodes which are not enlarged by size criteria. Reproductive: Intrauterine device in the uterus. Both ovaries appear prominent size, left  greater than right. Left ovary measures 5.5 x 3.4 cm. Other: Small fat containing umbilical hernia. No ascites. No free air. Musculoskeletal: There are no acute or suspicious osseous abnormalities. IMPRESSION: 1. No renal stones or obstructive uropathy. 2. Short segment small bowel wall thickening in the left upper quadrant with mild adjacent mesenteric edema, suggesting enteritis. 3. Dilated appendix measuring up to 11 mm with low-density, submucosal fatty infiltration versus low-density intraluminal contents. There is no evidence of acute appendicitis or appendiceal inflammation. Differential considerations include chronic appendicitis versus appendix mucocele. Recommend surgical consultation, which could be evaluated on an elective basis given pain is on the left rather than right side. 4. Both ovaries appear prominent size, left greater than right. Query polycystic ovarian syndrome, however ovaries and adnexal structures would be better characterized with ultrasound. 5. Moderate colonic stool burden with fecalization of small bowel contents, can be seen with constipation. Electronically Signed   By: Narda RutherfordMelanie  Sanford M.D.   On: 04/24/2019 05:29    ____________________________________________  PROCEDURES   Procedure(s) performed (including Critical Care):  Procedures  ____________________________________________  INITIAL IMPRESSION / MDM / ASSESSMENT AND PLAN / ED COURSE  As part of my medical decision making, I reviewed the following data within the electronic MEDICAL RECORD NUMBER Nursing notes reviewed and incorporated, Old chart reviewed, Notes from prior ED visits, and [place] Controlled Substance Database       *Shari Thornton was evaluated in Emergency Department on 04/24/2019 for the symptoms described in the history of present illness. She was evaluated in the context of the global COVID-19 pandemic, which necessitated consideration that the patient might be at risk for infection with the  SARS-CoV-2 virus that causes COVID-19. Institutional protocols and algorithms that pertain to the evaluation of patients at risk for COVID-19 are in a state of rapid change based on information released by regulatory bodies including the CDC and federal and state organizations. These policies and algorithms were followed during the patient's care in the ED.  Some ED evaluations and interventions may be delayed as a result of limited staffing during the pandemic.*     Medical Decision Making:  33 yo F here with L flank and LUQ pain. On arrival, VSS and WNL. Labs reviewed from triage - mild leukocytosis noted, o/w reassuring. UA unremarkable. CT scan as above. Interestingly, pt has focal enteritis in LUQ which would explain her LUQ pain/L flank pain, mild leukocytosis, and nausea. No fevers. No diarrhea. Will tx conservatively given her degree of pain w/ ABX an d analgesics. No focal PNA on CT and no SOB, hypoxia. Do not  suspect PE as pain is reproducible on exam, no hypoxia, no risk factors, no leg swelling. Otherwise, she has known ovarian cysts but no adnexal pain, no ovarian enlargement on CT and no signs of torsion clinically. No sx of PID.  Otherwise, incidental note made of abnormal appendix on CT. She has absolutely no RLQ TTP on exam. Suspect this is incidental but discussed with Dr. Celine Ahr. Will refer her for outpt CT W/contrast and further work-up, as ddx could includes appendiceal CA. Could also be related to her PCOS. This was discussed with pt and referral made.  ____________________________________________  FINAL CLINICAL IMPRESSION(S) / ED DIAGNOSES  Final diagnoses:  Left flank pain  Enteritis  Abnormal CT scan     MEDICATIONS GIVEN DURING THIS VISIT:  Medications  HYDROmorphone (DILAUDID) injection 1 mg (1 mg Intravenous Given 04/24/19 0837)  ketorolac (TORADOL) 30 MG/ML injection 30 mg (30 mg Intravenous Given 04/24/19 0837)  ondansetron (ZOFRAN) injection 4 mg (4 mg  Intravenous Given 04/24/19 0836)     ED Discharge Orders         Ordered    ondansetron (ZOFRAN ODT) 4 MG disintegrating tablet  Every 8 hours PRN     04/24/19 0927    HYDROcodone-acetaminophen (NORCO/VICODIN) 5-325 MG tablet  Every 6 hours PRN     04/24/19 0927    ciprofloxacin (CIPRO) 500 MG tablet  2 times daily     04/24/19 0927    metroNIDAZOLE (FLAGYL) 500 MG tablet  3 times daily     04/24/19 7253           Note:  This document was prepared using Dragon voice recognition software and may include unintentional dictation errors.   Duffy Bruce, MD 04/24/19 1037

## 2019-04-24 NOTE — ED Notes (Signed)
Pt to CT via w/c accomp by CT tech 

## 2020-11-24 ENCOUNTER — Ambulatory Visit (LOCAL_COMMUNITY_HEALTH_CENTER): Payer: Self-pay | Admitting: Advanced Practice Midwife

## 2020-11-24 ENCOUNTER — Ambulatory Visit: Payer: Self-pay | Admitting: Advanced Practice Midwife

## 2020-11-24 ENCOUNTER — Other Ambulatory Visit: Payer: Self-pay

## 2020-11-24 VITALS — BP 130/85 | Ht 60.6 in | Wt 235.0 lb

## 2020-11-24 DIAGNOSIS — Z3009 Encounter for other general counseling and advice on contraception: Secondary | ICD-10-CM

## 2020-11-24 DIAGNOSIS — E669 Obesity, unspecified: Secondary | ICD-10-CM | POA: Insufficient documentation

## 2020-11-24 DIAGNOSIS — F17200 Nicotine dependence, unspecified, uncomplicated: Secondary | ICD-10-CM | POA: Insufficient documentation

## 2020-11-24 DIAGNOSIS — Z3049 Encounter for surveillance of other contraceptives: Secondary | ICD-10-CM

## 2020-11-24 LAB — WET PREP FOR TRICH, YEAST, CLUE
Trichomonas Exam: NEGATIVE
Yeast Exam: NEGATIVE

## 2020-11-24 NOTE — Progress Notes (Signed)
[facility] Number: [phone]    Family Planning Visit- Initial Visit  Subjective:  Shari Thornton is a 35 y.o. SWF G2P2 (10, 75) smoker  being seen today for an initial annual visit and to discuss contraceptive options.  The patient is currently using IUD Mirena for pregnancy prevention. Patient reports she does not want a pregnancy in the next year.  Patient has the following medical conditions has Obesity BMI=44.9 on their problem list.  Chief Complaint  Patient presents with   Annual Exam    PE.  Possible IUD removal and reinsertion    Patient reports Mirena inserted 12/21/15 and last menses 10 years ago. Pt states she read on Mirena website that after 5 years she will have a return of menses and wants removal/reinsertion today. Counseled Mirena good for 7 yrs for birth control and she can wait until menses resumes or have removal if desires. Last pap 12/02/15 neg. Last sex yesterday without condom; with current partner x 15 years; 1 sex partner in last 3 mo. Smoking 1 ppd. Last MJ 12 years ago. Lst ETOH 7 years ago (1 mixed drink) "rarely". Employed 43 hours/wk and living with her 2 kids.   Patient denies vaping, cigars  Body mass index is 44.99 kg/m. - Patient is eligible for diabetes screening based on BMI and age >88?  not applicable CZ6S ordered? not applicable  Patient reports 1  partner/s in last year. Desires STI screening?  Yes  Has patient been screened once for HCV in the past?  No  No results found for: HCVAB  Does the patient have current drug use (including MJ), have a partner with drug use, and/or has been incarcerated since last result? No  If yes-- Screen for HCV through [facility]   Does the patient meet criteria for HBV testing? No  Criteria:  -Household, sexual or needle sharing contact with HBV -History of drug use -HIV positive -Those with known Hep C   Health  Maintenance Due  Topic Date Due   COVID-19 Vaccine (1) Never done   HIV Screening  Never done   Hepatitis C Screening  Never done   PAP SMEAR-Modifier  Never done   TETANUS/TDAP  03/24/2019    Review of Systems  All other systems reviewed and are negative.  The following portions of the patient's history were reviewed and updated as appropriate: allergies, current medications, past family history, past medical history, past social history, past surgical history and problem list. Problem list updated.   See flowsheet for other program required questions.  Objective:   Vitals:   11/24/20 1536  BP: 130/85  Weight: 235 lb (106.6 kg)  Height: 5' 0.6" (1.539 m)    Physical Exam Constitutional:      Appearance: Normal appearance. She is obese.  HENT:     Head: Normocephalic and atraumatic.     Mouth/Throat:     Mouth: Mucous membranes are moist.     Comments: Last dental exam 2022 Eyes:     Conjunctiva/sclera: Conjunctivae normal.  Neck:     Thyroid: No thyroid mass, thyromegaly or thyroid tenderness.  Cardiovascular:     Rate and Rhythm: Normal rate and regular rhythm.  Pulmonary:     Effort: Pulmonary effort is normal.     Breath sounds: Normal breath sounds.  Chest:  Breasts:    Right: Normal.     Left: Normal.  Abdominal:  Palpations: Abdomen is soft.     Comments: Soft without masses or tenderness, increased adipose  Genitourinary:    General: Normal vulva.     Exam position: Lithotomy position.     Vagina: Vaginal discharge (white creamy leukorrhea, ph<4.5) present.     Cervix: Normal.     Uterus: Normal.      Adnexa: Right adnexa normal and left adnexa normal.     Rectum: Normal.     Comments: Pap done; unable to visualize IUD strings but tip palpated in os; GC/Chlamydia done Musculoskeletal:        General: Normal range of motion.     Cervical back: Normal range of motion and neck supple.  Skin:    General: Skin is warm and dry.  Neurological:      Mental Status: She is alert.  Psychiatric:        Mood and Affect: Mood normal.      Assessment and Plan:  Shari Thornton is a 35 y.o. female presenting to the [facility] for an initial annual wellness/contraceptive visit  Contraception counseling: Reviewed all forms of birth control options in the tiered based approach. available including abstinence; over the counter/barrier methods; hormonal contraceptive medication including pill, patch, ring, injection,contraceptive implant, ECP; hormonal and nonhormonal IUDs; permanent sterilization options including vasectomy and the various tubal sterilization modalities. Risks, benefits, and typical effectiveness rates were reviewed.  Questions were answered.  Written information was also given to the patient to review.  Patient desires continuation of Mirena, this was prescribed for patient. She will follow up in  prn for surveillance.  She was told to call with any further questions, or with any concerns about this method of contraception.  Emphasized use of condoms 100% of the time for STI prevention.  Patient was offered ECP. ECP was not accepted by the patient. ECP counseling was not given - see RN documentation  1. Family planning Treat wet mount per standing orders Immunization nurse consult - WET PREP FOR [place], YEAST, CLUE - Chlamydia/Gonorrhea [facility] - IGP, Aptima HPV  2. Encounter for surveillance of other contraceptive Pt wants Mirena removed and reinserted because she read on Mirena website that at 5 years she will begin to get periods again. Counseled pt Mirena is good for 7 years to prevent pregnancy and she can wait until she begins to get menses again if she wants to have it removed (inserted 12/21/15); pt decides to wait for removal/reinsertion  3. Obesity, unspecified classification, unspecified obesity type, unspecified whether serious comorbidity present      No follow-ups on file.  No future  appointments.  Herbie Saxon, CNM

## 2020-11-24 NOTE — Progress Notes (Signed)
Pt here for PE and Pap.  Wet mount results reviewed, no treatment required.  Pt declined condoms. Berdie Ogren, RN

## 2020-11-24 NOTE — Progress Notes (Signed)
See other note on same date

## 2020-11-28 LAB — IGP, APTIMA HPV
HPV Aptima: NEGATIVE
PAP Smear Comment: 0

## 2021-06-02 IMAGING — CR DG CHEST 2V
1 series · 2 of 2 positions shown · non-contrast
Comparison: 08/05/2015.

CLINICAL DATA: Left chest pain.

EXAM:
CHEST - 2 VIEW

[Series 1: w chest pa · 0.14mm/px · 2 of 2 slices shown]
[im 1/2]
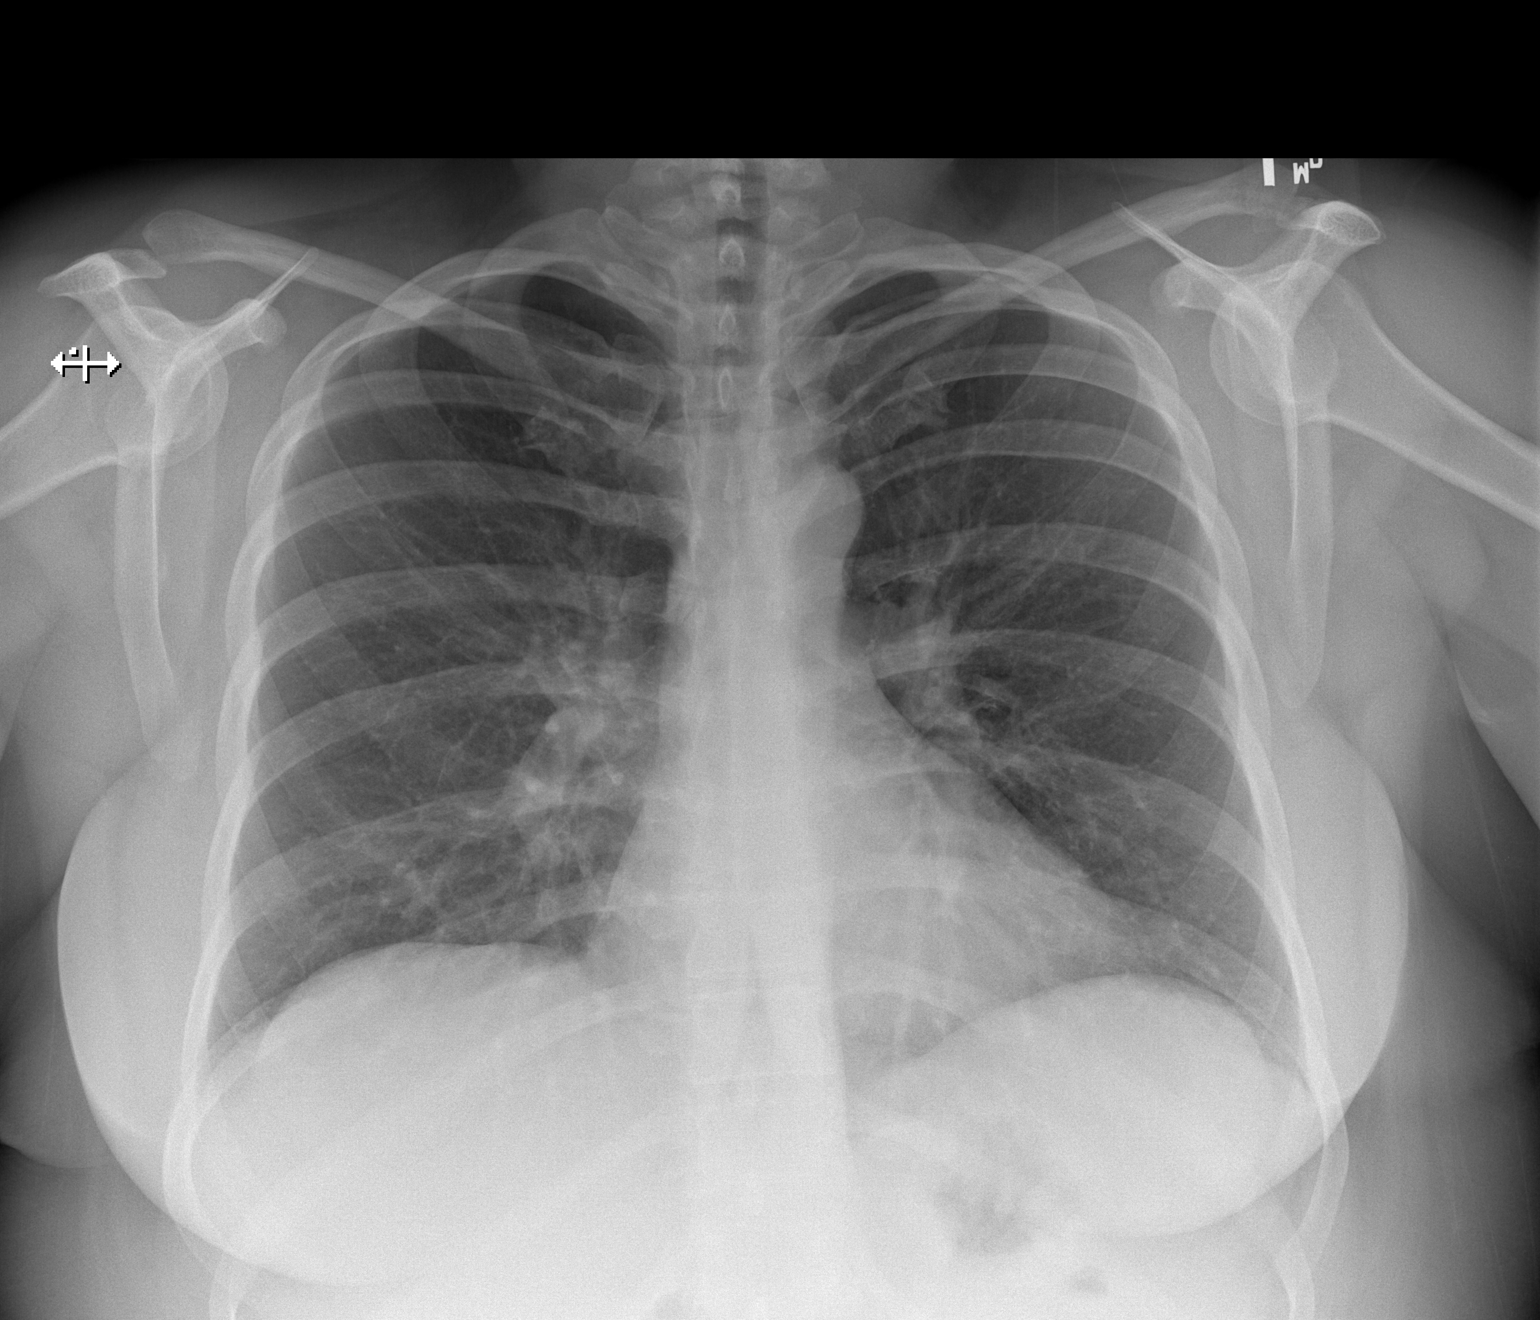
[im 2/2]
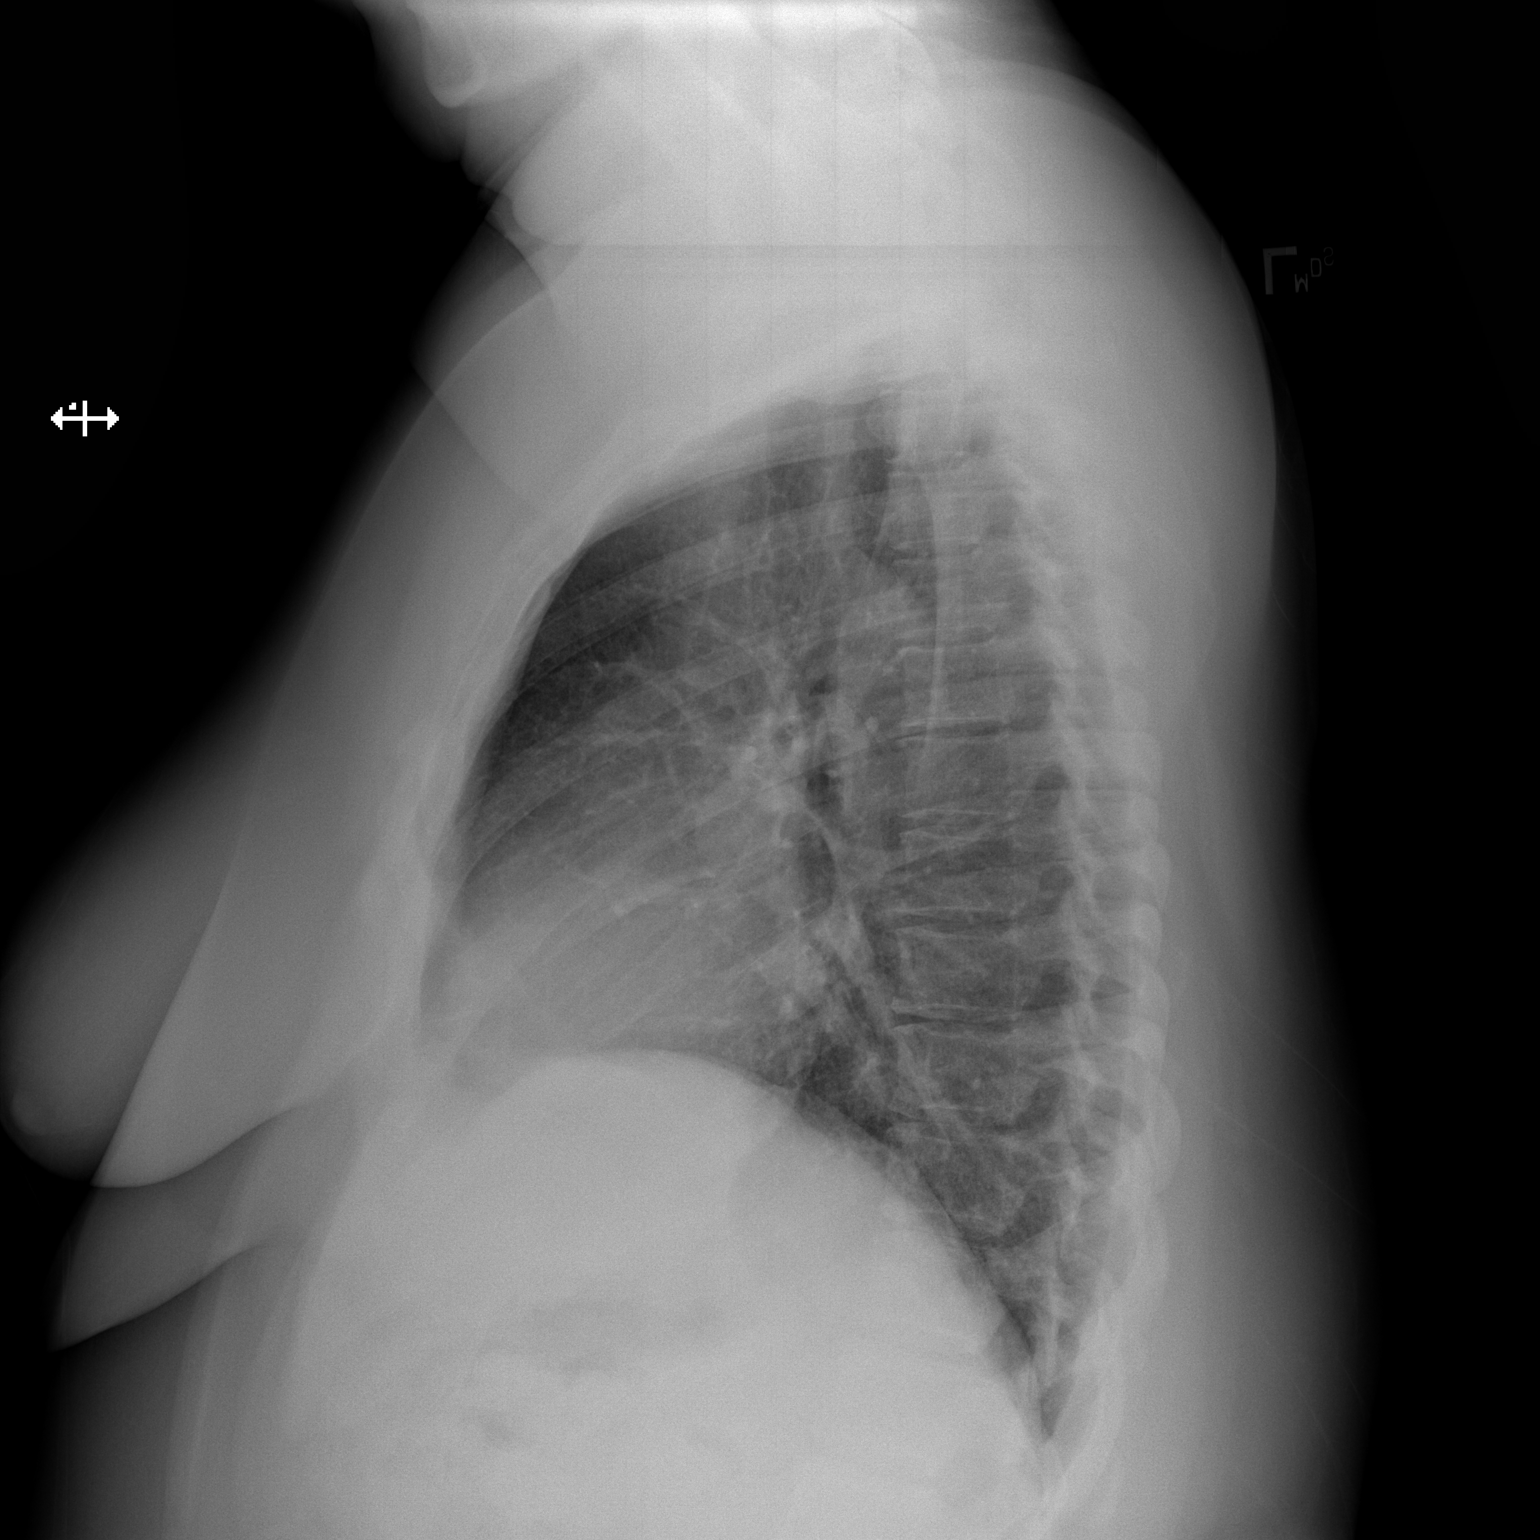

[2 of 2 positions shown; findings below may reference images not displayed]

FINDINGS: Mediastinum and hilar structures normal. Low lung volumes mild
bibasilar subsegmental atelectasis. No pleural effusion or
pneumothorax. No acute bony abnormality identified.
IMPRESSION: Low lung volumes mild bibasilar subsegmental atelectasis. Exam
otherwise unremarkable.

## 2021-06-02 IMAGING — CT CT RENAL STONE PROTOCOL
3 of 4 series · 8 of 46 positions shown, 15 images · non-contrast
Comparison: None.

CLINICAL DATA: Left flank pain.

EXAM:
CT ABDOMEN AND PELVIS WITHOUT CONTRAST
TECHNIQUE: Multidetector CT imaging of the abdomen and pelvis was performed
following the standard protocol without IV contrast.

[Series 4: lung bases · axial · 0.78mm/px · z∈[-582,-507]mm · 4 of 26 slices shown, 9 images]
[im 6/26  soft-tissue]
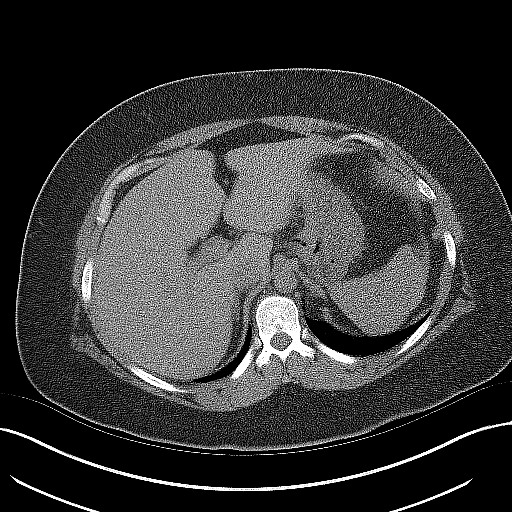
[im 6/26  lung]
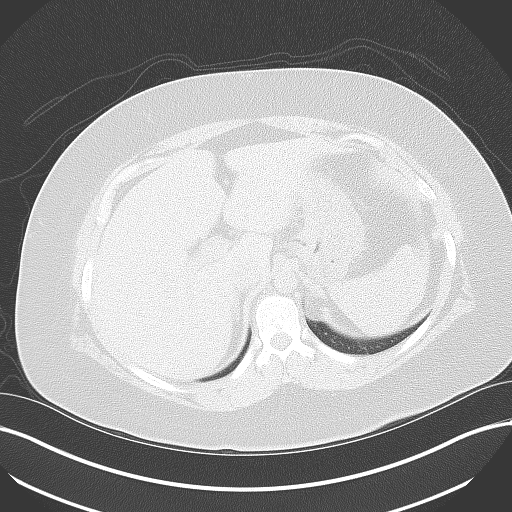
[im 6/26  bone]
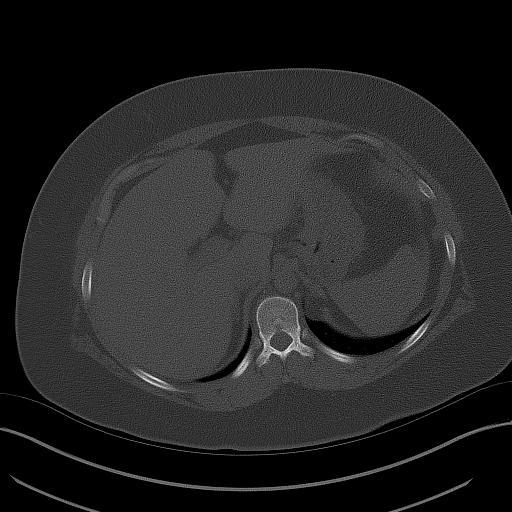
[im 11/26  soft-tissue]
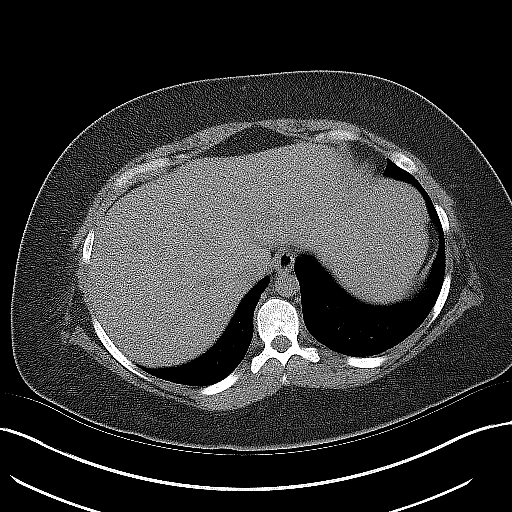
[im 11/26  lung]
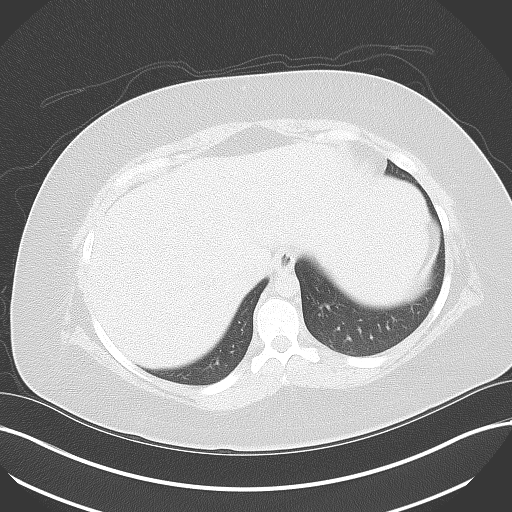
[im 16/26  soft-tissue]
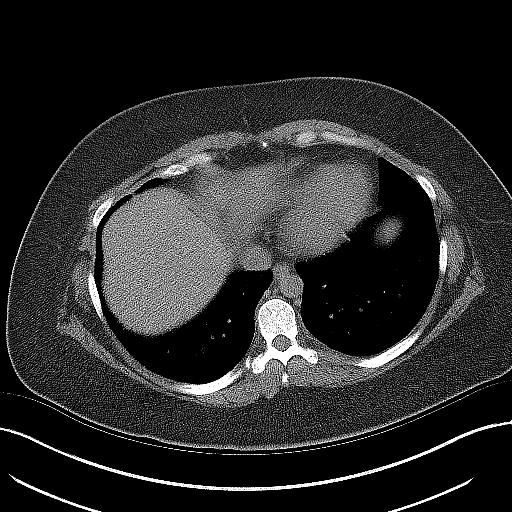
[im 16/26  lung]
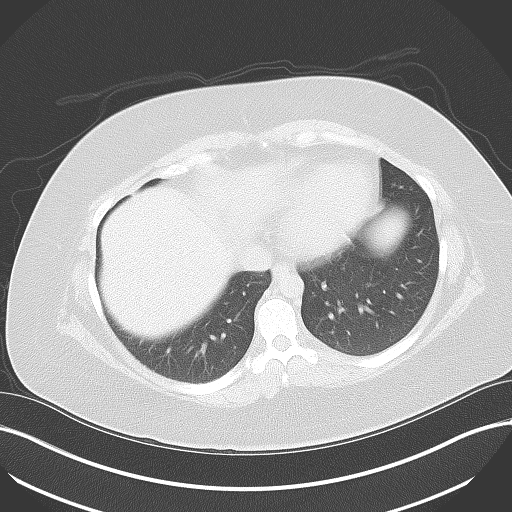
[im 21/26  soft-tissue]
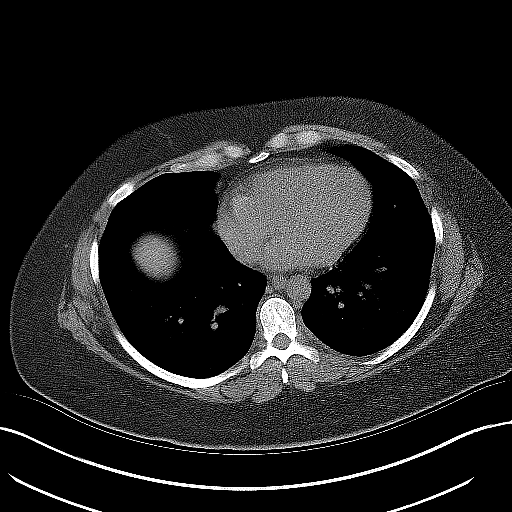
[im 21/26  lung]
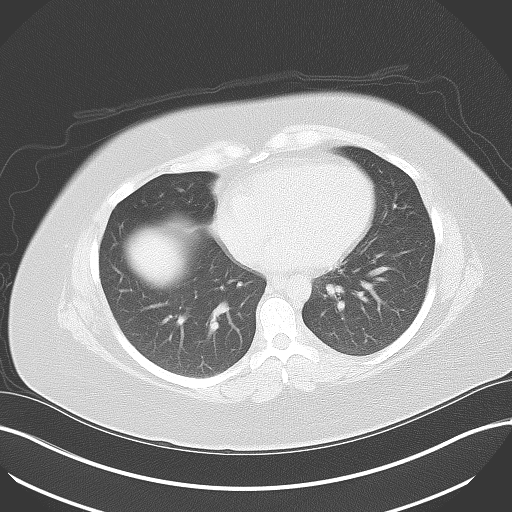

[Series 5: coronal · coronal · 0.73mm/px · 3 of 122 slices shown, 4 images]
[im 41/122  soft-tissue]
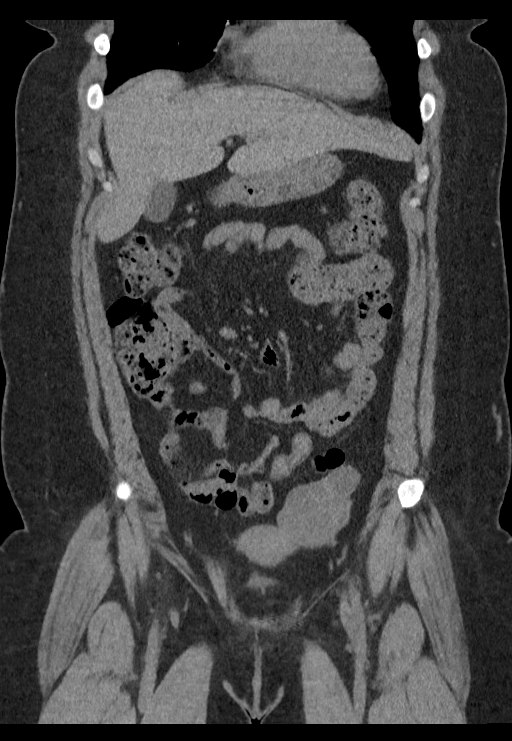
[im 54/122  soft-tissue]
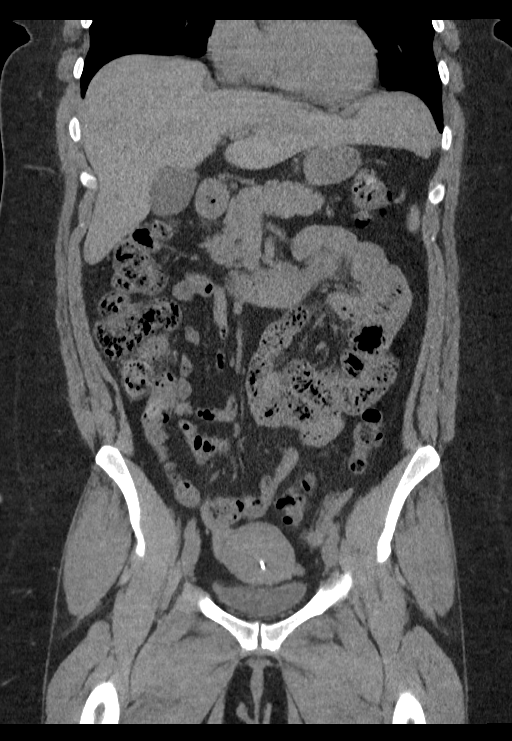
[im 54/122  bone]
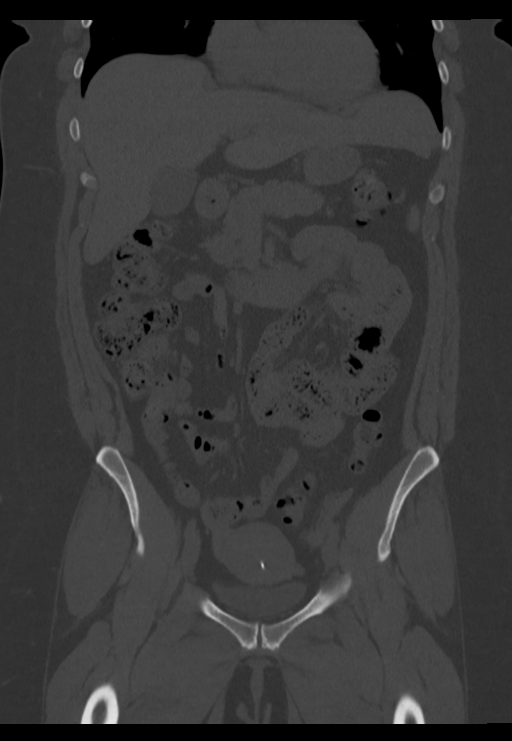
[im 68/122  soft-tissue]
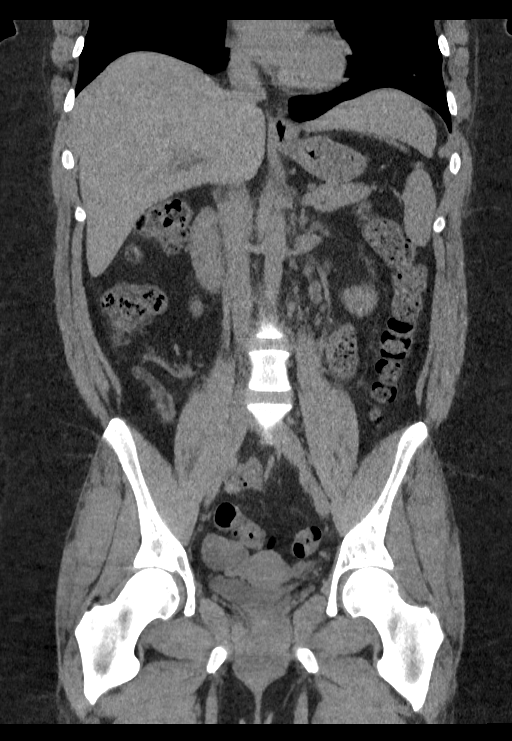

[Series 6: sagittal · sagittal · 0.62mm/px · 1 of 156 slices shown, 2 images]
[im 52/156  soft-tissue]
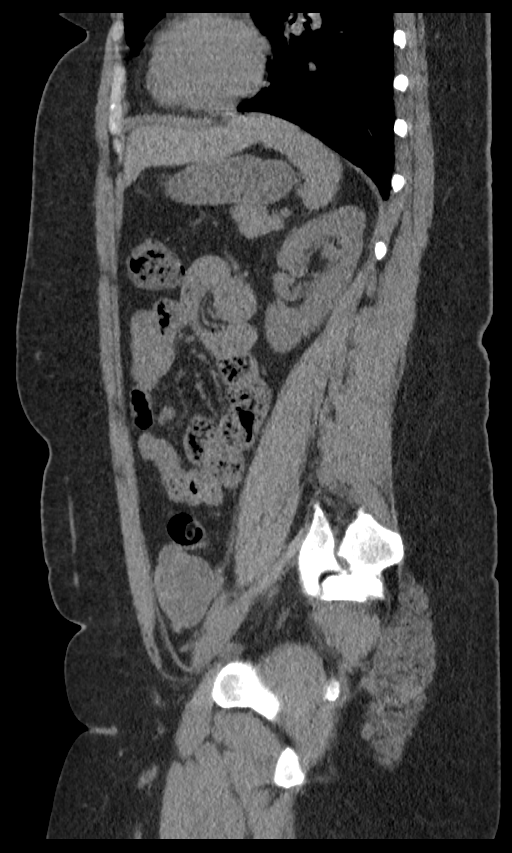
[im 52/156  bone]
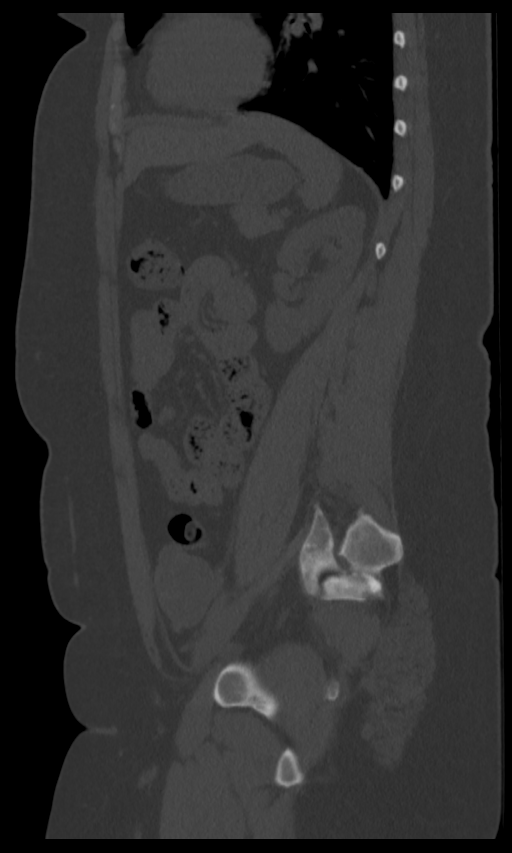

[8 of 46 positions shown; findings below may reference images not displayed]

FINDINGS: Lower chest: Lung bases are clear.

Hepatobiliary: No focal liver abnormality is seen. Elongated left
lobe of the liver wrapping into the left upper quadrant. No
gallstones, gallbladder wall thickening, or biliary dilatation.

Pancreas: No ductal dilatation or inflammation.

Spleen: Normal in size without focal abnormality.

Adrenals/Urinary Tract: Normal adrenal glands. No hydronephrosis. No
perinephric edema. No stones in the kidneys, course of the ureters
or urinary bladder. Urinary bladder is minimally distended. No
bladder wall thickening.

Stomach/Bowel: Stomach is nondistended and not well assessed.
Suspect straight segment of small bowel wall thickening in the left
upper quadrant, series 2, image 36 mild adjacent mesenteric edema.
No obstruction. There is fecalization of small bowel contents. The
appendix is dilated measuring up to 11 mm. Appendiceal low-density
may be submucosal fatty infiltration or intraluminal contents. No
periappendiceal fat stranding. No periappendiceal collection.
Moderate volume of stool throughout the entire colon. No colonic
wall thickening or inflammation.

Vascular/Lymphatic: Abdominal aorta is normal in caliber. Increased
number of small retroperitoneal nodes which are not enlarged by size
criteria.

Reproductive: Intrauterine device in the uterus. Both ovaries appear
prominent size, left greater than right. Left ovary measures 5.5 x
3.4 cm.

Other: Small fat containing umbilical hernia. No ascites. No free
air.

Musculoskeletal: There are no acute or suspicious osseous
abnormalities.
IMPRESSION: 1. No renal stones or obstructive uropathy.
2. Short segment small bowel wall thickening in the left upper
quadrant with mild adjacent mesenteric edema, suggesting enteritis.
3. Dilated appendix measuring up to 11 mm with low-density,
submucosal fatty infiltration versus low-density intraluminal
contents. There is no evidence of acute appendicitis or appendiceal
inflammation. Differential considerations include chronic
appendicitis versus appendix mucocele. Recommend surgical
consultation, which could be evaluated on an elective basis given
pain is on the left rather than right side.
4. Both ovaries appear prominent size, left greater than right.
Query polycystic ovarian syndrome, however ovaries and adnexal
structures would be better characterized with ultrasound.
5. Moderate colonic stool burden with fecalization of small bowel
contents, can be seen with constipation.

## 2022-03-21 ENCOUNTER — Emergency Department
Admission: EM | Admit: 2022-03-21 | Discharge: 2022-03-21 | Disposition: A | Discharge status: Home / Self Care | Payer: Self-pay | Attending: Emergency Medicine | Admitting: Emergency Medicine

## 2022-03-21 ENCOUNTER — Emergency Department: Payer: Self-pay

## 2022-03-21 ENCOUNTER — Encounter: Payer: Self-pay | Admitting: Emergency Medicine

## 2022-03-21 ENCOUNTER — Other Ambulatory Visit: Payer: Self-pay

## 2022-03-21 DIAGNOSIS — R102 Pelvic and perineal pain: Secondary | ICD-10-CM

## 2022-03-21 DIAGNOSIS — F17210 Nicotine dependence, cigarettes, uncomplicated: Secondary | ICD-10-CM | POA: Insufficient documentation

## 2022-03-21 DIAGNOSIS — N83201 Unspecified ovarian cyst, right side: Secondary | ICD-10-CM

## 2022-03-21 DIAGNOSIS — Z975 Presence of (intrauterine) contraceptive device: Secondary | ICD-10-CM

## 2022-03-21 DIAGNOSIS — N83202 Unspecified ovarian cyst, left side: Secondary | ICD-10-CM

## 2022-03-21 HISTORY — DX: Endometriosis, unspecified: N80.9

## 2022-03-21 LAB — URINALYSIS, COMPLETE (UACMP) WITH MICROSCOPIC
Bacteria, UA: NONE SEEN
Bilirubin Urine: NEGATIVE
Glucose, UA: NEGATIVE mg/dL
Hgb urine dipstick: NEGATIVE
Ketones, ur: NEGATIVE mg/dL
Nitrite: NEGATIVE
Protein, ur: NEGATIVE mg/dL
Specific Gravity, Urine: 1.016 (ref 1.005–1.030)
pH: 5 (ref 5.0–8.0)

## 2022-03-21 LAB — POC URINE PREG, ED: Preg Test, Ur: NEGATIVE

## 2022-03-21 MED ORDER — IBUPROFEN 800 MG PO TABS: 800.0000 mg | ORAL_TABLET | Freq: Three times a day (TID) | ORAL | 1 refills | Status: DC | PRN

## 2022-03-21 MED ORDER — HYDROCODONE-ACETAMINOPHEN 5-325 MG PO TABS
1.0000 | ORAL_TABLET | Freq: Once | ORAL | Status: AC
  Administered 2022-03-21: 1 via ORAL
  Filled 2022-03-21: qty 1

## 2022-03-21 MED ORDER — OXYCODONE-ACETAMINOPHEN 5-325 MG PO TABS: 1.0000 | ORAL_TABLET | Freq: Four times a day (QID) | ORAL | 0 refills | Status: DC | PRN

## 2022-03-21 MED ORDER — ONDANSETRON 4 MG PO TBDP: 4.0000 mg | ORAL_TABLET | Freq: Three times a day (TID) | ORAL | 0 refills | Status: DC | PRN

## 2022-03-21 MED ORDER — NORGESTIMATE-ETH ESTRADIOL 0.25-35 MG-MCG PO TABS: 1.0000 | ORAL_TABLET | Freq: Every day | ORAL | 0 refills | Status: DC

## 2022-03-21 MED ORDER — OXYCODONE-ACETAMINOPHEN 5-325 MG PO TABS
1.0000 | ORAL_TABLET | Freq: Once | ORAL | Status: AC
  Administered 2022-03-21: 1 via ORAL
  Filled 2022-03-21: qty 1

## 2022-03-21 NOTE — Consult Note (Signed)
Reason for Consult:Pelvic pain, Ovarian cyst, IUD in place Referring Physician: Donna Bernard, MD (ER Physician)  Shari Thornton is an 36 y.o. G2P2 female who presented to the Emergency for progressively worsening pelvic pain over the past week.  Patient reports that she initially thought that it was her IUD that was giving her trouble.  Had scheduled an appointment to be seen at the health department to have her IUD checked but could not get an appointment next week.  Notes that she was trying to wait until appointment but the pain became progressively worse to the point where she was experiencing nausea and vomiting today and so proceeded to the emergency room.  Work-up today on ultrasound reveals large left adnexal cyst approximately 7.7 cm in largest diameter, and is complex., also smaller right ovarian cyst, complex, 3.6 cm. Patient does report a history of diagnosis of endometriosis and small ovarian cysts in the past, but nothing ever this large or painful.   Pertinent Gynecological History: Menses:  Denies menstrual cycles as she has had a hormonal IUD in place for a total of 11 years (most recent one was replaced 6 years ago) Contraception: IUD Sexually transmitted diseases: no past history Previous GYN Procedures:  None   Last pap: normal Date: 11/24/2021   Menstrual History: No LMP recorded. (Menstrual status: IUD).    OB History     Gravida  2   Para  2   Term      Preterm      AB      Living  2      SAB      IAB      Ectopic      Multiple      Live Births              History reviewed. No pertinent past medical history.  Past Surgical History:  Procedure Laterality Date   CESAREAN SECTION     X 2.  2010 and 2012    Family History  Problem Relation Age of Onset   Colon cancer Mother 74       Dx at Stage 4    Social History:  reports that she has been smoking cigarettes. She has been smoking an average of 1 pack per day. She has never used  smokeless tobacco. She reports that she does not currently use alcohol. She reports that she does not currently use drugs after having used the following drugs: Marijuana.  Allergies:  Allergies  Allergen Reactions   Amoxil [Amoxicillin] Hives   Eggs Or Egg-Derived Products Swelling   Penicillins Rash    Medications: I have reviewed the patient's current medications. Prior to Admission: (Not in a hospital admission)   Review of Systems  Constitutional:  Negative for appetite change and fever.  Respiratory: Negative.    Cardiovascular: Negative.   Gastrointestinal:  Positive for nausea and vomiting. Negative for diarrhea.  Genitourinary:  Positive for pelvic pain. Negative for dysuria, flank pain, menstrual problem, vaginal bleeding and vaginal discharge.  Neurological: Negative.     Blood pressure (!) 130/90, pulse 70, temperature 98 F (36.7 C), temperature source Oral, resp. rate 18, height 5' (1.524 m), weight 99.8 kg, SpO2 98 %. Physical Exam Constitutional:      Appearance: Normal appearance.     Comments: Mild distress  HENT:     Head: Normocephalic and atraumatic.  Cardiovascular:     Pulses: Normal pulses.     Heart sounds: Normal heart sounds.  Pulmonary:     Effort: Pulmonary effort is normal.     Breath sounds: Normal breath sounds.  Abdominal:     General: Abdomen is flat. There is no distension.     Palpations: Abdomen is soft.     Tenderness: There is abdominal tenderness. There is no guarding or rebound.  Genitourinary:    Comments: Deferred Musculoskeletal:        General: Normal range of motion.  Skin:    General: Skin is warm and dry.  Neurological:     Mental Status: She is alert and oriented to person, place, and time.    Labs:   Results for orders placed or performed during the hospital encounter of 03/21/22 (from the past 48 hour(s))  Urinalysis, Complete w Microscopic Urine, Clean Catch     Status: Abnormal   Collection Time: 03/21/22 10:43  AM  Result Value Ref Range   Color, Urine YELLOW (A) YELLOW   APPearance CLOUDY (A) CLEAR   Specific Gravity, Urine 1.016 1.005 - 1.030   pH 5.0 5.0 - 8.0   Glucose, UA NEGATIVE NEGATIVE mg/dL   Hgb urine dipstick NEGATIVE NEGATIVE   Bilirubin Urine NEGATIVE NEGATIVE   Ketones, ur NEGATIVE NEGATIVE mg/dL   Protein, ur NEGATIVE NEGATIVE mg/dL   Nitrite NEGATIVE NEGATIVE   Leukocytes,Ua LARGE (A) NEGATIVE   RBC / HPF 11-20 0 - 5 RBC/hpf   WBC, UA 6-10 0 - 5 WBC/hpf   Bacteria, UA NONE SEEN NONE SEEN   Squamous Epithelial / LPF 21-50 0 - 5   Mucus PRESENT     Comment: Performed at [facility], [address] [place], [place] [address]  POC Urine Pregnancy, ED     Status: None   Collection Time: 03/21/22 10:50 AM  Result Value Ref Range   Preg Test, Ur NEGATIVE NEGATIVE    Comment:        THE SENSITIVITY OF THIS METHODOLOGY IS >24 mIU/mL     Imaging:  US PELVIC COMPLETE WITH TRANSVAGINAL  Result Date: 03/21/2022 CLINICAL DATA:  Pelvic pain, IUD EXAM: TRANSABDOMINAL AND TRANSVAGINAL ULTRASOUND OF PELVIS DOPPLER ULTRASOUND OF OVARIES TECHNIQUE: Both transabdominal and transvaginal ultrasound examinations of the pelvis were performed. Transabdominal technique was performed for global imaging of the pelvis including uterus, ovaries, adnexal regions, and pelvic cul-de-sac. It was necessary to proceed with endovaginal exam following the transabdominal exam to visualize the endometrium, and to characterize adnexal abnormalities. Color and duplex Doppler ultrasound was utilized to evaluate blood flow to the ovaries. COMPARISON:  10/29/2013 FINDINGS: Uterus Measurements: 9.6 x 4.6 x 6.2 cm = volume: 144 mL. Anteverted. Anterior wall deformity suspect prior Caesarean section scar. Heterogeneous myometrium. Nabothian cysts at cervix. No focal uterine mass. Endometrium Thickness: 5 mm. IUD identified at upper uterine segment endometrial canal. No endometrial fluid or mass Right ovary  Measurements: 6.1 x 3.4 x 3.7 cm = volume: 4.0 mL. Complicated cyst RIGHT ovary 3.6 x 3.4 x 3.4 cm containing scattered internal echogenicity/debris and a partial septation. No abnormal blood flow on color Doppler imaging. Left ovary Measurements: 8.0 x 5.0 x 7.6 cm = volume: 162 mL. Replaced by a large complex cystic lesion 7.7 x 7.4 x 5.5 cm which contains multiple septations up to 2.7 mm thick. Minimal scattered internal echogenicity. No discrete mural nodularity. Pulsed Doppler evaluation of both ovaries demonstrates normal low-resistance arterial and venous waveforms. Other findings Small amount of nonspecific free pelvic fluid. No additional adnexal masses. IMPRESSION: Question prior Caesarean section. IUD  identified at the upper uterine segment endometrial canal. Complicated cyst of the RIGHT ovary 3.6 cm greatest size, containing a partial septation and scattered internal echogenicity, nonspecific, could potentially represent a resolving hemorrhagic cyst; follow-up ultrasound recommended in 6-12 weeks. Complex cystic lesion of the LEFT ovary 7.7 cm greatest size, containing multiple septations up to 2.7 mm thick; surgical evaluation recommended to exclude cystic ovarian neoplasm. No evidence of ovarian torsion. Electronically Signed   By: Ulyses Southward M.D.   On: 03/21/2022 12:29   US PELVIC DOPPLER (TORSION R/O OR MASS ARTERIAL FLOW)  Result Date: 03/21/2022 CLINICAL DATA:  Pelvic pain, IUD EXAM: TRANSABDOMINAL AND TRANSVAGINAL ULTRASOUND OF PELVIS DOPPLER ULTRASOUND OF OVARIES TECHNIQUE: Both transabdominal and transvaginal ultrasound examinations of the pelvis were performed. Transabdominal technique was performed for global imaging of the pelvis including uterus, ovaries, adnexal regions, and pelvic cul-de-sac. It was necessary to proceed with endovaginal exam following the transabdominal exam to visualize the endometrium, and to characterize adnexal abnormalities. Color and duplex Doppler ultrasound  was utilized to evaluate blood flow to the ovaries. COMPARISON:  10/29/2013 FINDINGS: Uterus Measurements: 9.6 x 4.6 x 6.2 cm = volume: 144 mL. Anteverted. Anterior wall deformity suspect prior Caesarean section scar. Heterogeneous myometrium. Nabothian cysts at cervix. No focal uterine mass. Endometrium Thickness: 5 mm. IUD identified at upper uterine segment endometrial canal. No endometrial fluid or mass Right ovary Measurements: 6.1 x 3.4 x 3.7 cm = volume: 4.0 mL. Complicated cyst RIGHT ovary 3.6 x 3.4 x 3.4 cm containing scattered internal echogenicity/debris and a partial septation. No abnormal blood flow on color Doppler imaging. Left ovary Measurements: 8.0 x 5.0 x 7.6 cm = volume: 162 mL. Replaced by a large complex cystic lesion 7.7 x 7.4 x 5.5 cm which contains multiple septations up to 2.7 mm thick. Minimal scattered internal echogenicity. No discrete mural nodularity. Pulsed Doppler evaluation of both ovaries demonstrates normal low-resistance arterial and venous waveforms. Other findings Small amount of nonspecific free pelvic fluid. No additional adnexal masses. IMPRESSION: Question prior Caesarean section. IUD identified at the upper uterine segment endometrial canal. Complicated cyst of the RIGHT ovary 3.6 cm greatest size, containing a partial septation and scattered internal echogenicity, nonspecific, could potentially represent a resolving hemorrhagic cyst; follow-up ultrasound recommended in 6-12 weeks. Complex cystic lesion of the LEFT ovary 7.7 cm greatest size, containing multiple septations up to 2.7 mm thick; surgical evaluation recommended to exclude cystic ovarian neoplasm. No evidence of ovarian torsion. Electronically Signed   By: Ulyses Southward M.D.   On: 03/21/2022 12:29    Assessment/Plan: Bilateral adnexal cysts - reviewed ultrasound findings with patient.  Discussed possible etiologies of complex cysts including hemorrhagic, endometriomas (more likely due to history of  endometriosis), or malignancy (less likely due to age and no family or personal history of ovarian cancer).  Reviewed options for medical management (OCPs in addition to her IUD for ~ 6 weeks) and pain medications for pain control, vs surgical intervention with bilateral ovarian cystectomy, possible left oophorectomy.  Patient reports that she did receive some pain relief with medication she received in the emergency room (Norco).  Is willing to try conservative management with pain medications and OCPs as she also reports that she does not have insurance at this time.  I discussed that if medical management fails, would still need to consider surgical intervention however can have patient apply for Medicaid or can receive charity care through [facility] or perhaps [facility] if possible.  Patient notes understanding.  We will  follow-up in 2 weeks to reassess pain levels.  Advised need for ultrasound in approximately 6 weeks to follow-up on cysts. IUD in place- patient with current IUD, initially thought to be the cause of her pain.  Ultrasound notes that IUD is and correct positioning at this time.  Rubie Maid, MD [facility] 03/21/2022

## 2022-03-21 NOTE — ED Provider Notes (Signed)
[facility] Provider Note    Event Date/Time   First MD Initiated Contact with Patient 03/21/22 1028     (approximate)  History   Chief Complaint: Pelvic Pain  HPI  Shari Thornton is a 36 y.o. female with no significant past medical history to the emergency department for pelvic pain.  According to the patient for the past 11 years she has had an IUD in place.  Approximately 6 years ago she had her IUD replaced and states is not due for placement for another 2 years.  She states however over the past 4 to 5 days she has been experiencing intermittent sharp pains in her pelvis and several days ago had some light vaginal spotting.  Patient is concerned that her IUD may have migrated.  Patient states she is already called her OB to make an appointment but is not until next week and she was having pain so she came to the emergency department.  Physical Exam   Triage Vital Signs: ED Triage Vitals  Enc Vitals Group     BP 03/21/22 1002 (!) 130/96     Pulse Rate 03/21/22 1002 75     Resp 03/21/22 1002 18     Temp 03/21/22 1002 98.3 F (36.8 C)     Temp Source 03/21/22 1002 Oral     SpO2 03/21/22 1002 98 %     Weight 03/21/22 1003 220 lb (99.8 kg)     Height 03/21/22 1003 5' (1.524 m)     Head Circumference --      Peak Flow --      Pain Score 03/21/22 1002 6     Pain Loc --      Pain Edu? --      Excl. in GC? --     Most recent vital signs: Vitals:   03/21/22 1002  BP: (!) 130/96  Pulse: 75  Resp: 18  Temp: 98.3 F (36.8 C)  SpO2: 98%    General: Awake, no distress.  CV:  Good peripheral perfusion.  Regular rate and rhythm  Resp:  Normal effort.  Equal breath sounds bilaterally.  Abd:  No distention.  Soft, nontender.  No rebound or guarding.  Benign abdomen.   ED Results / Procedures / Treatments   RADIOLOGY  Patient's ultrasound resulted showing a 3.5 cm right ovarian cyst as well as a 7.7 cm left ovarian cyst both of which look to be  somewhat complex however the left cyst appears to be more septated concerning for possible ovarian neoplasm versus complex cystic structure.   MEDICATIONS ORDERED IN ED: Medications - No data to display   IMPRESSION / MDM / ASSESSMENT AND PLAN / ED COURSE  I reviewed the triage vital signs and the nursing notes.  Patient's presentation is most consistent with acute presentation with potential threat to life or bodily function.  Patient presents emergency department for intermittent sharp pelvic pain as well as light vaginal spotting.  Patient is concerned that her IUD may have shifted or migrated.  Patient states she is never been able to feel the IUD strings but states last time her OB checked everything appeared to be in the proper positioning.  Patient has a benign abdomen, reassuring vitals.  We will obtain a pelvic ultrasound as well as a urinalysis and urine pregnancy as a precaution.  Patient agreeable to plan of care.  Ultrasound shows a concerning left-sided ovarian cyst.  Concern for possible neoplasm.  The remainder the  patient's work-up is largely within normal limits, no findings on the patient's urinalysis.  Pregnancy test is negative.  I spoke to the midwife Rod Can who will be down to see the patient and then speak to the supervising physician regarding plan of care.  I have updated the patient on the ultrasound findings.  FINAL CLINICAL IMPRESSION(S) / ED DIAGNOSES   Pelvic pain Ovarian cyst   Note:  This document was prepared using Dragon voice recognition software and may include unintentional dictation errors.   Harvest Dark, MD 03/22/22 1459

## 2022-03-21 NOTE — ED Notes (Signed)
First Nurse Note: Pt to ED via POV, pt states that she thinks that her IUD has moved. Pt states that she is having pain in her pelvic area.

## 2022-03-21 NOTE — ED Triage Notes (Signed)
Pt to ER states she believes her IUD may have "shifted".  Pt reports pelvic pain, difficulty urinating and vaginal bleeding.  Pt states she has had this IUD 6 years and one prior to that for 5 years and has never had vaginal bleeding with the IUD.  Pt states she has never been able to feel the strings from the IUD and at her last check a year ago, all was in place.

## 2022-03-21 NOTE — ED Notes (Signed)
Pt still waiting for consult

## 2022-03-21 NOTE — ED Provider Notes (Signed)
Emergency department handoff note  Care of this patient was signed out to me at the end of the previous provider shift.  All pertinent patient information was conveyed and all questions were answered.  Patient pending OB/GYN evaluation.  Patient was deemed stable for discharge and orders were written for outpatient treatment as well as instructions for follow-up.  The patient has been reexamined and is ready to be discharged.  All diagnostic results have been reviewed and discussed with the patient/family.  Care plan has been outlined and the patient/family understands all current diagnoses, results, and treatment plans.  There are no new complaints, changes, or physical findings at this time.  All questions have been addressed and answered.  All medications, if any, that were given while in the emergency department or any that are being prescribed have been reviewed with the patient/family.  All side effects and adverse reactions have been explained.  Patient was instructed to, and agrees to follow-up with their primary care physician as well as return to the emergency department if any new or worsening symptoms develop.   Naaman Plummer, MD 03/21/22 2340

## 2022-03-21 NOTE — ED Notes (Signed)
Pt is pacing to nursing station   Explained the wait  Provider in with pt

## 2022-03-22 ENCOUNTER — Encounter: Payer: Self-pay | Admitting: Obstetrics and Gynecology

## 2022-03-27 ENCOUNTER — Ambulatory Visit: Payer: Self-pay

## 2022-03-28 ENCOUNTER — Telehealth: Payer: Self-pay | Admitting: Obstetrics and Gynecology

## 2022-03-28 ENCOUNTER — Other Ambulatory Visit: Payer: Self-pay | Admitting: Obstetrics and Gynecology

## 2022-03-28 MED ORDER — OXYCODONE-ACETAMINOPHEN 5-325 MG PO TABS: 1.0000 | ORAL_TABLET | Freq: Four times a day (QID) | ORAL | 0 refills | Status: DC | PRN

## 2022-03-28 NOTE — Telephone Encounter (Signed)
From: Paulita Cradle To: Office of Hildred Laser, MD Sent: 03/28/2022 6:42 AM EST Subject: Medication Renewal Request  Refills have been requested for the following medications:   oxyCODONE-acetaminophen (PERCOCET) 5-325 MG tablet Toni Amend Kensly Bowmer]  Preferred pharmacy: [id number] [place], [place] - [address] OF [address] Delivery method: [employer]

## 2022-03-28 NOTE — Telephone Encounter (Signed)
I contacted patient via phone. Patient is aware of location, date, time and arrival.

## 2022-03-28 NOTE — Telephone Encounter (Signed)
-----   Message from Hildred Laser, MD sent at 03/27/2022  8:25 PM EST ----- Regarding: RE: ER follow up Patient can f/u on Nov 22 at 4:15.  ----- Message ----- From: Kathi Ludwig Sent: 03/27/2022  10:32 AM EST To: Clydia Llano, MD; Kathi Ludwig; # Subject: ER follow up                                   Patient is calling to scheduled an ER follow up for ovarian cyst. Patient wasn't sure when she would be contacted to scheduled follow up and possible surgery. There is no opening this week nor next week. Please advise scheduling?

## 2022-04-05 ENCOUNTER — Ambulatory Visit (INDEPENDENT_AMBULATORY_CARE_PROVIDER_SITE_OTHER): Payer: Self-pay | Admitting: Obstetrics and Gynecology

## 2022-04-05 ENCOUNTER — Encounter: Payer: Self-pay | Admitting: Obstetrics and Gynecology

## 2022-04-05 VITALS — BP 118/83 | HR 75 | Resp 16 | Ht 60.5 in | Wt 236.4 lb

## 2022-04-05 DIAGNOSIS — N83202 Unspecified ovarian cyst, left side: Secondary | ICD-10-CM

## 2022-04-05 DIAGNOSIS — R102 Pelvic and perineal pain: Secondary | ICD-10-CM

## 2022-04-05 DIAGNOSIS — N83201 Unspecified ovarian cyst, right side: Secondary | ICD-10-CM

## 2022-04-05 DIAGNOSIS — Z8742 Personal history of other diseases of the female genital tract: Secondary | ICD-10-CM

## 2022-04-05 MED ORDER — KETOROLAC TROMETHAMINE 10 MG PO TABS: 10.0000 mg | ORAL_TABLET | Freq: Four times a day (QID) | ORAL | 0 refills | Status: DC | PRN

## 2022-04-05 MED ORDER — OXYCODONE-ACETAMINOPHEN 5-325 MG PO TABS: 1.0000 | ORAL_TABLET | Freq: Four times a day (QID) | ORAL | 0 refills | Status: DC | PRN

## 2022-04-05 NOTE — Progress Notes (Signed)
GYNECOLOGY CLINIC PROGRESS NOTE  Subjective:    Shari Thornton is a 36 y.o. G2P2 female who presents for Emergency Room follow up due to pelvic pain and bilateral ovarian cysts. Has an IUD in place. Also has a history of endometriosis. Patient was evaluated on 03/21/2022 at the ED for pelvic pain/IUD assessment due to complaints of intermittent sharp pains in her pelvis and several days ago had some light vaginal spotting.  Findings on ultrasound noted bilateral ovarian cysts, 3.5 cm on right and 7.7 cm on left.  Ruled out for torsion. She was discharged home with pain medications and combined OCPs (preferred to hold off on surgery during that time as she had no insurance, unless absolutely medically necessary).   Today patient reports that she is still having pain, however gradually builds throughout the day and is worse  mostly in the evenings after work.  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 Then comes home and manages housework and cooks dinner for her kids.  Notes that she only takes the strong pain medication (Percocet when she gets home and finishes her tasks) but feels that by the time she does, her pain is at an 8 or 10, and has to take another dose shortly after which may bring her pain to a 6.  Has difficulty sleeping at night due to the pain. Is also taking Ibuprofen 800 mg during the day while working, which does not help much.    Menstrual History: Menarche age: 70 No LMP recorded. (Menstrual status: IUD).    Gynecologic History:  Contraception: IUD History of STI's: Denies Last Pap: 11/24/2020. Results were: normal.  Denies h/o abnormal pap smears.  OB History  Gravida Para Term Preterm AB Living  2 2 0 0 0 2  SAB IAB Ectopic Multiple Live Births  0 0 0 0 0    # Outcome Date GA Lbr Len/2nd Weight Sex Delivery Anes PTL Lv  2 Para           1 Para             Past Medical History:   Diagnosis Date   Endometriosis     Past Surgical History:  Procedure Laterality Date   CESAREAN SECTION     X 2.  2010 and 2012    Family History  Problem Relation Age of Onset   Colon cancer Mother 73       Dx at Stage 4    Social History   Socioeconomic History   Marital status: Single    Spouse name: Not on file   Number of children: Not on file   Years of education: Not on file   Highest education level: Not on file  Occupational History   Not on file  Tobacco Use   Smoking status: Every Day    Packs/day: 1.00    Types: Cigarettes   Smokeless tobacco: Never  Vaping Use   Vaping Use: Never used  Substance and Sexual Activity   Alcohol use: Not Currently    Comment: last use 7 years ago   Drug use: Not Currently    Types: Marijuana    Comment: Hasn't had marijuana in 12 years   Sexual activity: Yes    Partners: Male    Birth control/protection: I.U.D.  Other Topics Concern   Not on file  Social  History Narrative   Not on file   Social Determinants of Health   Financial Resource Strain: Not on file  Food Insecurity: Not on file  Transportation Needs: Not on file  Physical Activity: Not on file  Stress: Not on file  Social Connections: Not on file  Intimate Partner Violence: Not At Risk (11/24/2020)   Humiliation, Afraid, Rape, and Kick questionnaire    Fear of Current or Ex-Partner: No    Emotionally Abused: No    Physically Abused: No    Sexually Abused: No    Current Outpatient Medications on File Prior to Visit  Medication Sig Dispense Refill   ibuprofen (ADVIL) 800 MG tablet Take 1 tablet (800 mg total) by mouth every 8 (eight) hours as needed. 60 tablet 1   norgestimate-ethinyl estradiol (ORTHO-CYCLEN) 0.25-35 MG-MCG tablet Take 1 tablet by mouth daily. 84 tablet 0   ondansetron (ZOFRAN ODT) 4 MG disintegrating tablet Take 1 tablet (4 mg total) by mouth every 8 (eight) hours as needed for nausea or vomiting. 15 tablet 0    oxyCODONE-acetaminophen (PERCOCET) 5-325 MG tablet Take 1-2 tablets by mouth every 6 (six) hours as needed for severe pain or moderate pain. 30 tablet 0   No current facility-administered medications on file prior to visit.    Allergies  Allergen Reactions   Amoxil [Amoxicillin] Hives   Eggs Or Egg-Derived Products Swelling   Penicillins Rash     Review of Systems Pertinent items noted in HPI and remainder of comprehensive ROS otherwise negative.     Objective:  Blood pressure 118/83, pulse 75, resp. rate 16, height 5' 0.5" (1.537 m), weight 236 lb 6.4 oz (107.2 kg).  Body mass index is 45.41 kg/m.  General appearance: alert and no distress Abdomen:  soft, mildly tender in LLQ.  Pelvic: deferred Extremities: extremities normal, atraumatic, no cyanosis or edema Neurologic: Grossly normal     Imaging:  CLINICAL DATA:  Pelvic pain, IUD   EXAM: TRANSABDOMINAL AND TRANSVAGINAL ULTRASOUND OF PELVIS   DOPPLER ULTRASOUND OF OVARIES   TECHNIQUE: Both transabdominal and transvaginal ultrasound examinations of the pelvis were performed. Transabdominal technique was performed for global imaging of the pelvis including uterus, ovaries, adnexal regions, and pelvic cul-de-sac.   It was necessary to proceed with endovaginal exam following the transabdominal exam to visualize the endometrium, and to characterize adnexal abnormalities. Color and duplex Doppler ultrasound was utilized to evaluate blood flow to the ovaries.   COMPARISON:  10/29/2013   FINDINGS: Uterus   Measurements: 9.6 x 4.6 x 6.2 cm = volume: 144 mL. Anteverted. Anterior wall deformity suspect prior Caesarean section scar. Heterogeneous myometrium. Nabothian cysts at cervix. No focal uterine mass.   Endometrium   Thickness: 5 mm. IUD identified at upper uterine segment endometrial canal. No endometrial fluid or mass   Right ovary   Measurements: 6.1 x 3.4 x 3.7 cm = volume: 4.0 mL. Complicated  cyst RIGHT ovary 3.6 x 3.4 x 3.4 cm containing scattered internal echogenicity/debris and a partial septation. No abnormal blood flow on color Doppler imaging.   Left ovary   Measurements: 8.0 x 5.0 x 7.6 cm = volume: 162 mL. Replaced by a large complex cystic lesion 7.7 x 7.4 x 5.5 cm which contains multiple septations up to 2.7 mm thick. Minimal scattered internal echogenicity. No discrete mural nodularity.   Pulsed Doppler evaluation of both ovaries demonstrates normal low-resistance arterial and venous waveforms.   Other findings   Small amount of nonspecific free pelvic fluid. No  additional adnexal masses.   IMPRESSION: Question prior Caesarean section.   IUD identified at the upper uterine segment endometrial canal.   Complicated cyst of the RIGHT ovary 3.6 cm greatest size, containing a partial septation and scattered internal echogenicity, nonspecific, could potentially represent a resolving hemorrhagic cyst; follow-up ultrasound recommended in 6-12 weeks.   Complex cystic lesion of the LEFT ovary 7.7 cm greatest size, containing multiple septations up to 2.7 mm thick; surgical evaluation recommended to exclude cystic ovarian neoplasm.   No evidence of ovarian torsion.   Labs:   No new labs  Assessment:   1. Bilateral ovarian cysts   2. Pelvic pain   3. History of endometriosis      Plan:   1. Bilateral ovarian cysts -Patient currently 2 weeks on combined OCPs.  Advised on trial of at least weeks to assess if ovarian cyst can be reduced in size.  If no reduction in size by the end of this time.  May need to consider surgical intervention if patient's pain continues to remain significant. - US PELVIC COMPLETE WITH TRANSVAGINAL; Future, to schedule in 4 weeks to reassess cyst size  2. Pelvic pain -Patient still noting fairly significant pelvic pain, but mostly in the evenings after strenuous work today.  I discussed better ways to manage her pain.  I will  prescribe Toradol and she is to take this during the daytime instead of the Motrin.  Can also utilize 1 Percocet towards the middle of the day to keep her pain from escalating significantly by the end of the day.  In the evening time she can take an additional 1 to 2 tablets after work if needed.  New prescription given.  3. History of endometriosis -Patient with history of endometriosis, cannot rule out that cyst may be endometriomas and may not respond to hormonal management.  I discussed this with patient.  Still may require surgical intervention in the end.  RTC in 4 weeks for [place] and f/u with MD  Hildred Laser, MD [facility]

## 2022-04-11 ENCOUNTER — Other Ambulatory Visit: Payer: Self-pay | Admitting: Obstetrics and Gynecology

## 2022-04-12 ENCOUNTER — Other Ambulatory Visit: Payer: Self-pay | Admitting: Obstetrics and Gynecology

## 2022-04-12 MED ORDER — OXYCODONE-ACETAMINOPHEN 5-325 MG PO TABS: 1.0000 | ORAL_TABLET | Freq: Four times a day (QID) | ORAL | 0 refills | Status: DC | PRN

## 2022-04-14 MED ORDER — KETOROLAC TROMETHAMINE 10 MG PO TABS: 10.0000 mg | ORAL_TABLET | Freq: Four times a day (QID) | ORAL | 0 refills | Status: DC | PRN

## 2022-04-18 ENCOUNTER — Other Ambulatory Visit: Payer: Self-pay | Admitting: Obstetrics and Gynecology

## 2022-04-18 MED ORDER — OXYCODONE-ACETAMINOPHEN 5-325 MG PO TABS: 1.0000 | ORAL_TABLET | Freq: Four times a day (QID) | ORAL | 0 refills | Status: DC | PRN

## 2022-04-26 ENCOUNTER — Other Ambulatory Visit: Payer: Self-pay | Admitting: Obstetrics and Gynecology

## 2022-04-26 MED ORDER — OXYCODONE-ACETAMINOPHEN 5-325 MG PO TABS: 1.0000 | ORAL_TABLET | Freq: Four times a day (QID) | ORAL | 0 refills | Status: DC | PRN

## 2022-04-27 NOTE — Telephone Encounter (Signed)
[facility] pharmacy called about Shari Thornton getting another refill on Percocet, Pharmacy stated that she's already had over 140 pills in quite some time, So, I did deny it until patient came in on 12/20 with Dr. Valentino Saxon.

## 2022-04-27 NOTE — Telephone Encounter (Signed)
Yes, please inform them to refill.  This will likely be her last refill as we will be discussing surgery for management of her pain and cysts at her upcoming visit.

## 2022-04-27 NOTE — Telephone Encounter (Signed)
Dr. Valentino Saxon would you like for me to go ahead and refill this or no until she sees you on the 12/20?  Please advise

## 2022-04-28 ENCOUNTER — Telehealth: Payer: Self-pay | Admitting: Obstetrics and Gynecology

## 2022-04-28 ENCOUNTER — Other Ambulatory Visit: Payer: Self-pay

## 2022-04-28 DIAGNOSIS — N83201 Unspecified ovarian cyst, right side: Secondary | ICD-10-CM

## 2022-04-28 MED ORDER — IBUPROFEN 800 MG PO TABS: 800.0000 mg | ORAL_TABLET | Freq: Three times a day (TID) | ORAL | 1 refills | Status: DC | PRN

## 2022-04-28 MED ORDER — TRAMADOL HCL 50 MG PO TABS: 100.0000 mg | ORAL_TABLET | Freq: Four times a day (QID) | ORAL | 0 refills | Status: DC | PRN

## 2022-04-28 NOTE — Telephone Encounter (Signed)
Contacted patient, informed that pharmacy would no longer allow any further prescriptions for Percocet due to high quantity use over the past 4 weeks.  Patient notes understanding, advised on use of Tramadol and alternating with Ibuprofen/Tylenol until appointment next week where we would likely discuss surgical management of her cysts due to ineffective medical management with OCPs. Patient has questions about up front cost due to lack of insurance. Directed her to financial services at [facility], advised to apply for assistance.  Notes that she did not qualify for Medicaid.

## 2022-05-03 ENCOUNTER — Other Ambulatory Visit: Payer: Self-pay

## 2022-05-03 ENCOUNTER — Ambulatory Visit: Payer: Self-pay | Admitting: Obstetrics and Gynecology

## 2024-01-23 ENCOUNTER — Ambulatory Visit: Payer: Self-pay

## 2024-01-24 ENCOUNTER — Ambulatory Visit: Payer: Self-pay

## 2024-01-24 VITALS — BP 127/88 | HR 72 | Ht 60.5 in | Wt 245.0 lb

## 2024-01-24 DIAGNOSIS — Z3009 Encounter for other general counseling and advice on contraception: Secondary | ICD-10-CM

## 2024-01-24 DIAGNOSIS — Z30433 Encounter for removal and reinsertion of intrauterine contraceptive device: Secondary | ICD-10-CM

## 2024-01-24 DIAGNOSIS — Z113 Encounter for screening for infections with a predominantly sexual mode of transmission: Secondary | ICD-10-CM

## 2024-01-24 LAB — WET PREP FOR TRICH, YEAST, CLUE
Trichomonas Exam: NEGATIVE
Yeast Exam: NEGATIVE

## 2024-01-24 MED ORDER — LEVONORGESTREL 20.1 MCG/DAY IU IUD: 1.0000 | INTRAUTERINE_SYSTEM | Freq: Once | INTRAUTERINE | Status: DC

## 2024-01-24 NOTE — Progress Notes (Signed)
 FP education card given and reviewed with pt, condoms declined. Larraine JONELLE Northern, RN

## 2024-01-24 NOTE — Progress Notes (Signed)
 [employer] HE[facility] DEPARTMENT [facility] [address], [address] [place] [address] Main phone: [phone]  Family Planning Visit - Repeat Yearly Visit  Subjective:  Shari Thornton is a 38 y.o. G2P2  being seen today for an annual wellness visit and to discuss contraception options. The patient is currently using IUD (or IUS) for pregnancy prevention. Patient does not want a pregnancy in the next year.   Patient reports they are looking for a method with the following characteristics:  Method that does not involve too much memory, high efficacy at preventing pregnancy  Patient has the following medical problems:  Patient Active Problem List   Diagnosis Date Noted   Obesity BMI=44.9 11/24/2020   Smoker 1 ppd 11/24/2020   Chief Complaint  Patient presents with   Annual Exam    Iud    HPI Patient reports desire for IUD replacement. This will be her third IUD. She has really liked the IUD for birth control as well as cycle control (she has no periods).   Patient denies other concerns. No vaginal, breast concerns.  Pap NILM in 2022. Due 2027. No history of abnormal pap tests per pt.  Review of Systems  All other systems reviewed and are negative.  See flowsheet for further details and programmatic requirements Hyperlink available at the top of the signed note in blue.  Flow sheet content below:  Pregnancy Intention Screening Does the patient want to become pregnant in the next year?: No Does the patient's partner want to become pregnant in the next year?: No Would the patient like to discuss contraceptive options today?: Yes Other:  Password: nea Is it okay to contact you by mail?: Yes Difficulty accessing hygiene products (feminine products/soap) in the last 3 months: No Contraception History Past methods of contraception used by patient:: Intrauterine device or system (IDU,IUS) Adverse effects associated with Intrauterine device or system  (IUD,IUS): none Sexual History What age did you start your period?: 15 How often do you have your period?: none Date of last sex?: 01/10/24 Has the patient had unprotected sex within the last 5 days?: No Do you have sex with men, women, both men and women?: Men only In the past 2 months how many partners have you had sex with?: 1 In the past 12 months, how many partners have you had sex with?: 1 Is it possible that any of your sex partners in the past 12 months had sex with someone else whild they were still in a sexual relationship with you?: No What ways do you have sex?: Vaginal Do you or your partner use condoms and/or dental dams every time you have vaginal, oral or anal sex?: No Do you douche?: No Date of last HIV test?: 11/24/20 Have you ever had an STD?: No Have any of your partners had an STD?: No Have you or your partner ever shot up drugs?: No Have any of your partners used drugs in the past?: No Have you or your partners exchanged money or drugs for sex?: No Counseling Education: Make informed decision about family planning, Provided preconception counseling, Reduce risk of transmission and protection from STD's and HIV, Understand BMI >25 or >18.5 is a health risk (weight management educational materials to be provided to client requests), Promoted daily consumption of MVI with folic acid if capable of conceiving., Review immunization history, inform client of recommended vaccines per CDC's ACIP Guidelines and refer to Immunization clinic, No immunization record found in [place], Results of physical  assessment and labs (if performed), How to discontinue the method selected and information on back up method used, How to use the method selected and information on back up method used, How to use the method consistently and correctly, Teach back method completed, Warning signs for rare but serious adverse events and what to do if they experience a warning sign (including emergency 24 hour  number, where to seek emergency service outside of hours of operation), When to return for follow up (planned return schedule), PCP list given to patient, Is patient pregnant? Contraception Wrap Up Current Method: IUD or IUS End Method: IUD or IUS Contraception Counseling Provided: Yes How was the end contraceptive method provided?: Provided on site  Diabetes screening This patient is 38 y.o. with a BMI of Body mass index is 47.06 kg/m.SABRA  Is patient eligible for diabetes screening (age >35 and BMI >25)?  yes  Was Hgb A1c ordered? no  STI screening Patient reports 1 of partners in last year.  Does this patient desire STI screening?  Yes, just gonorrhea/chlamydia Declines all blood tests  Cervical Cancer Screening   Due next in July 2027  Result Date Procedure Results Follow-ups  11/24/2020 IGP, Aptima HPV DIAGNOSIS:: Comment Specimen adequacy:: Comment Clinician Provided ICD10: Comment Performed by:: Comment PAP Smear Comment: . Note:: Comment Test Methodology: Comment HPV Aptima: Negative    Health Maintenance Due  Topic Date Due   HIV Screening  Never done   Hepatitis C Screening  Never done   Pneumococcal Vaccine (1 of 2 - PCV) Never done   HPV VACCINES (1 - 3-dose SCDM series) Never done   DTaP/Tdap/Td (2 - Td or Tdap) 03/24/2019   Influenza Vaccine  Never done   COVID-19 Vaccine (1 - 2024-25 season) Never done   The following portions of the patient's history were reviewed and updated as appropriate: allergies, current medications, past family history, past medical history, past social history, past surgical history and problem list. Problem list updated.  Objective:   Vitals:   01/24/24 1045  BP: 127/88  Pulse: 72  Weight: 245 lb (111.1 kg)  Height: 5' 0.5 (1.537 m)   Physical Exam Vitals and nursing note reviewed. Exam conducted with a chaperone present Calleen Northern and Dr. Macario).  Constitutional:      Appearance: Normal appearance.  HENT:     Head:  Normocephalic and atraumatic.     Mouth/Throat:     Mouth: Mucous membranes are moist.     Pharynx: Oropharynx is clear. No oropharyngeal exudate or posterior oropharyngeal erythema.  Pulmonary:     Effort: Pulmonary effort is normal.  Chest:  Breasts:    Right: No swelling, bleeding, inverted nipple, mass, nipple discharge, skin change or tenderness.     Left: No swelling, bleeding, inverted nipple, mass, nipple discharge, skin change or tenderness.  Abdominal:     General: Abdomen is flat.     Palpations: There is no mass.     Tenderness: There is no abdominal tenderness. There is no rebound.  Genitourinary:    General: Normal vulva.     Exam position: Lithotomy position.     Pubic Area: No rash or pubic lice.      Labia:        Right: No rash or lesion.        Left: No rash or lesion.      Vagina: Normal. No vaginal discharge, erythema, bleeding or lesions.     Cervix: No cervical motion tenderness, discharge, friability,  lesion or erythema.     Comments: No IUD strings visualized Lymphadenopathy:     Head:     Right side of head: No preauricular or posterior auricular adenopathy.     Left side of head: No preauricular or posterior auricular adenopathy.     Cervical: No cervical adenopathy.     Upper Body:     Right upper body: No supraclavicular, axillary or epitrochlear adenopathy.     Left upper body: No supraclavicular, axillary or epitrochlear adenopathy.  Skin:    General: Skin is warm and dry.     Findings: No rash.  Neurological:     Mental Status: She is alert and oriented to person, place, and time.    Assessment and Plan:  Shari Thornton is a 38 y.o. female G2P2 presenting to the [facility] Department for an yearly wellness and contraception visit  Family planning  Contraception counseling:  Reviewed options based on patient desire and reproductive life plan. Patient is interested in IUD or IUS. This was not provided to the patient today due to  IUD strings NOT visualized on exam/old IUD not able to be removed.  Risks, benefits, and typical effectiveness rates were reviewed.  Questions were answered.  Written information was also given to the patient to review.    The patient was told to call with any further questions, or with any concerns about this method of contraception.  Emphasized use of condoms 100% of the time for STI prevention.  2. Screening for venereal disease (Primary)  - WET PREP FOR TRICH, YEAST, CLUE - Chlamydia/Gonorrhea [facility]  3. Encounter for removal and reinsertion of IUD  - IUD strings unfortunately not visible on exam. Attempted to bring strings into view with a pap brush as well as an IUD hook without success. IUD not removed. - She desires removal and reinsertion of IUD, currently has Mirena  placed 8 years ago.  - Ambulatory referral to Obstetrics / Gynecology  - Also sent referral to [facility] as patient does not have any insurance, desired referral both locally and to [facility] in order to discuss availability/cost with both  - She declines Depo for back up contraception  Return with [facility] or [facility] for IUD removal and reinsertion.  No future appointments.  Damien FORBES Satchel, NP

## 2024-03-11 ENCOUNTER — Encounter: Payer: Self-pay | Admitting: Obstetrics & Gynecology
